# Patient Record
Sex: Male | Born: 2000
Health system: Southern US, Community
[De-identification: ages and names within clinical notes are randomized; demographics above are authoritative.]

## PROBLEM LIST (undated history)

## (undated) DIAGNOSIS — R319 Hematuria, unspecified: Secondary | ICD-10-CM

## (undated) HISTORY — PX: WISDOM TOOTH EXTRACTION: SHX21

## (undated) HISTORY — PX: FOOT SURGERY: SHX648

## (undated) HISTORY — DX: Hematuria, unspecified: R31.9

---
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
## 2018-01-11 ENCOUNTER — Ambulatory Visit: Payer: BLUE CROSS/BLUE SHIELD | Admitting: Nurse Practitioner

## 2018-01-15 ENCOUNTER — Ambulatory Visit (INDEPENDENT_AMBULATORY_CARE_PROVIDER_SITE_OTHER): Payer: BLUE CROSS/BLUE SHIELD | Admitting: Family Medicine

## 2018-01-15 ENCOUNTER — Encounter: Payer: Self-pay | Admitting: Family Medicine

## 2018-01-15 VITALS — BP 110/75 | HR 112 | Temp 98.2°F | Resp 16 | Ht 68.0 in | Wt 154.0 lb

## 2018-01-15 DIAGNOSIS — J029 Acute pharyngitis, unspecified: Secondary | ICD-10-CM

## 2018-01-15 DIAGNOSIS — Z202 Contact with and (suspected) exposure to infections with a predominantly sexual mode of transmission: Secondary | ICD-10-CM

## 2018-01-15 LAB — POCT RAPID STREP A (OFFICE): Rapid Strep A Screen: NEGATIVE

## 2018-01-15 NOTE — Progress Notes (Signed)
Subjective:    Patient ID: Carlos Newman, male    DOB: 03/21/2001, 17 y.o.   MRN: [id number]  Carlos GullyFrederick H Robarts is a 17 y.o. male presenting on 01/15/2018 for Sore Throat (onset week. cough a little) and Abdominal Pain (for a while as per patient)  Patient presents for a same day appointment. Patient's PCP is Wilhelmina McardleLauren Kennedy, AGPCNP-BC  HPI   Sore Throat / Possible Bacterial Pharyngitis vs Oropharyngeal STD - Reports symptoms started about 1 week ago with some "white spots" on throat, still sore to swallow and has swollen lymph nodes in front - Prior history of sore throat again back initially 07/2017, had sick contact sister with strep positive, he had culture that ultimately was negative for strep and he was treated with Azithromycin and it was negative - Concern about environmental exposure with animals and concern with "mold" or "animal dander" - Also had similar issue back in May 2019 with swollen lymph nodes - Also has sick contact with girlfriend who had similar sore throat and exudates in back of throat, he admits that symptoms seemed to have onset after oral sex contact. And believes that this may be related. - He has been to health department before for testing and he had negative testing back in April 2019 before new sexual partner. Denies significant fatigue, headache, vision changes, rash, numbness weakness tingling, dysuria, hematuria, urinary changes, ulcer or lesion or genital rash  Additional complaint with chronic abdominal pain - previously seen in past by prior PCP with dx of GERD, and determined uncertain etiology with generalized abdominal pain, last visit was 05/2017, considered CT imaging and had other work-up at that time.  Depression screen [facility] 2/9 05/11/2017 04/28/2015 01/13/2015  Decreased Interest 0 0 0  Down, Depressed, Hopeless 1 0 0  PHQ - 2 Score 1 0 0  Altered sleeping 2 - -  Tired, decreased energy 2 - -  Change in appetite 2 - -  Feeling bad or  failure about yourself  2 - -  Trouble concentrating 1 - -  Moving slowly or fidgety/restless 1 - -  Suicidal thoughts 0 - -  PHQ-9 Score 11 - -  Difficult doing work/chores Somewhat difficult - -    Social History   Tobacco Use  . Smoking status: Current Every Day Smoker  . Smokeless tobacco: Current User    Types: Chew  Substance Use Topics  . Alcohol use: Yes    Alcohol/week: 0.0 oz  . Drug use: Yes    Types: Marijuana    Review of Systems Per HPI unless specifically indicated above     Objective:    BP 110/75   Pulse (!) 112   Temp 98.2 F (36.8 C) (Oral)   Resp 16   Ht 5\' 8"  (1.727 m)   Wt 154 lb (69.9 kg)   SpO2 100%   BMI 23.42 kg/m   Wt Readings from Last 3 Encounters:  01/15/18 154 lb (69.9 kg) (51 %, Z= 0.02)*  07/31/17 153 lb 12.8 oz (69.8 kg) (53 %, Z= 0.08)*  05/15/17 147 lb (66.7 kg) (43 %, Z= -0.17)*   * Growth percentiles are based on CDC (Boys, 2-20 Years) data.    Physical Exam  Constitutional: He is oriented to person, place, and time. He appears well-developed and well-nourished. No distress.  Well-appearing, comfortable, cooperative  HENT:  Head: Normocephalic and atraumatic.  Frontal / maxillary sinuses non-tender. Nares patent without purulence or edema.  Oropharynx with significantly enlarged  and edematous tonsils bilateral with some erythema similar appearaince possibly R>L without obvious exudates, seems to have large crypts, posterior pharynx with some mild erythema no focal lesions. No ulceration. No drainage or discharge. No evidence of oral thrush or other oral lesions  Eyes: Conjunctivae are normal. Right eye exhibits no discharge. Left eye exhibits no discharge.  Neck: Normal range of motion. Neck supple. No thyromegaly present.  Cardiovascular: Regular rhythm, normal heart sounds and intact distal pulses.  No murmur heard. Tachycardic  Pulmonary/Chest: Effort normal and breath sounds normal. No respiratory distress. He has no  wheezes. He has no rales.  Genitourinary:  Genitourinary Comments: Declined external male genital exam today.  Musculoskeletal: Normal range of motion. He exhibits no edema.  Lymphadenopathy:    He has no cervical adenopathy (L>R upper anterior cervical lymph tenderness without adenopathy).  Neurological: He is alert and oriented to person, place, and time.  Skin: Skin is warm and dry. No rash noted. He is not diaphoretic. No erythema.  Psychiatric: He has a normal mood and affect. His behavior is normal.  Well groomed, good eye contact, normal speech and thoughts  Nursing note and vitals reviewed.  Results for orders placed or performed in visit on 01/15/18  POCT rapid strep A  Result Value Ref Range   Rapid Strep A Screen Negative Negative      Assessment & Plan:   Problem List Items Addressed This Visit    Sore throat - Primary   Relevant Orders   POCT rapid strep A (Completed)    Other Visit Diagnoses    Possible exposure to STD          Clinically persistent pharyngitis with significant symptoms seems to be triggered by oral sex with new partner, in past had similar problem following oral sex. He does not have any other symptoms of STD or urethritis at this time. No known exposure, but partner also has similar sore throat symptoms similar onset. - Prior history of treated strep multiple times, including azithromycin course, he has PCN allergy  Plan Discussion on treatment / testing options - given concern with partner having symptoms, and request for confidentiality/cost - we agreed for him to pursue care and testing at [facility] promptly - I called their office, spoke with nurse triage, he was seen back in 07/2017 for STD check, and he is registered as patient, and they can get him scheduled first thing tomorrow AM for full evaluation and treatment at that time if indicated as discussed.  Ultimately if turns out STD testing is negative, and possibly more  bacterial pharyngitis, could still be from oral sex, or other trigger, he may need future ENT evaluation if abnormal tonsils and recurrent pharyngitis  Regarding chronic abdominal complaint, advised him that I will review this with his PCP and request that he follow-up with her to discuss further, seems last visit 05/2017, and may need advanced imaging or 2nd opinion if continued issues.  No orders of the defined types were placed in this encounter.   Follow up plan: Return in about 1 week (around 01/22/2018), or if symptoms worsen or fail to improve, for sore throat if not improved.   Saralyn Pilar, DO [facility] 01/15/2018, 6:51 PM

## 2018-01-15 NOTE — Patient Instructions (Addendum)
Thank you for coming to the office today.  Please go to health dept today to get soonest available apt - may call   Appointments (for all clinics except [facility])  STD Clinic - [phone]  [facility] Address: [address], [place], [place] [address] Phone: [phone]  Please schedule a Follow-up Appointment to: Return in about 1 week (around 01/22/2018), or if symptoms worsen or fail to improve, for sore throat if not improved.  If you have any other questions or concerns, please feel free to call the office or send a message through MyChart. You may also schedule an earlier appointment if necessary.  Additionally, you may be receiving a survey about your experience at our office within a few days to 1 week by e-mail or mail. We value your feedback.  Saralyn PilarAlexander Karamalegos, DO [facility]

## 2018-01-16 ENCOUNTER — Other Ambulatory Visit: Payer: Self-pay

## 2018-01-16 ENCOUNTER — Ambulatory Visit
Admission: EM | Admit: 2018-01-16 | Discharge: 2018-01-16 | Disposition: A | Discharge status: Home / Self Care | Payer: BLUE CROSS/BLUE SHIELD | Attending: Emergency Medicine | Admitting: Emergency Medicine

## 2018-01-16 DIAGNOSIS — J029 Acute pharyngitis, unspecified: Secondary | ICD-10-CM | POA: Diagnosis not present

## 2018-01-16 DIAGNOSIS — R350 Frequency of micturition: Secondary | ICD-10-CM | POA: Diagnosis not present

## 2018-01-16 DIAGNOSIS — Z113 Encounter for screening for infections with a predominantly sexual mode of transmission: Secondary | ICD-10-CM | POA: Diagnosis not present

## 2018-01-16 LAB — URINALYSIS, COMPLETE (UACMP) WITH MICROSCOPIC
BILIRUBIN URINE: NEGATIVE
Bacteria, UA: NONE SEEN
GLUCOSE, UA: NEGATIVE mg/dL
Hgb urine dipstick: NEGATIVE
Ketones, ur: NEGATIVE mg/dL
LEUKOCYTES UA: NEGATIVE
Nitrite: NEGATIVE
PROTEIN: NEGATIVE mg/dL
RBC / HPF: NONE SEEN RBC/hpf (ref 0–5)
SQUAMOUS EPITHELIAL / LPF: NONE SEEN (ref 0–5)
Specific Gravity, Urine: 1.01 (ref 1.005–1.030)
pH: 7 (ref 5.0–8.0)

## 2018-01-16 LAB — CHLAMYDIA/NGC RT PCR (ARMC ONLY)
CHLAMYDIA TR: NOT DETECTED
N gonorrhoeae: NOT DETECTED

## 2018-01-16 LAB — MONONUCLEOSIS SCREEN: Mono Screen: NEGATIVE

## 2018-01-16 MED ORDER — DEXAMETHASONE SODIUM PHOSPHATE 10 MG/ML IJ SOLN
10.0000 mg | Freq: Once | INTRAMUSCULAR | Status: AC
Start: 2018-01-16 — End: 2018-01-16
  Administered 2018-01-16: 10 mg via INTRAMUSCULAR

## 2018-01-16 MED ORDER — IBUPROFEN 600 MG PO TABS: 600.0000 mg | ORAL_TABLET | Freq: Four times a day (QID) | ORAL | 0 refills | Status: DC | PRN

## 2018-01-16 MED ORDER — FLUTICASONE PROPIONATE 50 MCG/ACT NA SUSP: 2.0000 | Freq: Every day | NASAL | 0 refills | Status: DC

## 2018-01-16 NOTE — ED Provider Notes (Addendum)
HPI  SUBJECTIVE:  Carlos Newman is a 17 y.o. male who presents with exudates on his tonsils, swollen tonsils and cervical lymphadenopathy since May after having oral sex with a new male partner in late May.  States that she had vaginal odor and they did not have vaginal intercourse.  States that she had recently had mono.  He had oral sex with a different male partner last week and reports sore throat, recurrent exudates since then.  He states that she has similar symptoms now as well.  He states that his most recent partner did not have blisters on her genitalia at the time with oral sex.  He reports difficulty swallowing and breathing secondary to swelling, raspy voice.  No drooling, trismus, neck stiffness.  No body aches, headaches, left upper quadrant pain, rash.  No unintentional weight loss, night sweats.  He reports nasal congestion and postnasal drip, but denies allergy symptoms.  He reports urinary frequency, no dysuria, urgency, cloudy odorous urine, hematuria.  No penile rash, discharge, genital itching.  No testicular swelling, pain, scrotal swelling or pain.  He has tried garlic for the tonsillar exudates with improvement in his symptoms.  He also tried ibuprofen 400 mg several days ago.  No aggravating factors.  He is a heavy smoker.  He has a past medical history of UTIs, hematuria, recurrent strep is a child.  No history of gonorrhea, chlamydia, HIV, HSV, syphilis, Trichomonas.  No history of pyelonephritis, nephrolithiasis, mono, diabetes.  Patient reports having unprotected penile/vaginal intercourse with another, different, male partner back in December, and is worried about HIV as well.  ZOX:WRUEAVW, Alison Stalling, NP   Patient saw his primary care physician yesterday, had a negative rapid strep.  He was noted to have large, erythematous tonsils without any obvious exudates.  He agreed to pursue care and testing at the [facility] department yesterday, but states  that he went there this morning and they did not do any testing, so he came here for STD and mono testing. States that he has a penicillin allergy, reaction was over 10 years ago.  Reports flat red rash.  Denies urticaria, lip or tongue swelling, difficulty breathing, diarrhea, syncope, anaphylaxis.  States that he has tolerated Rocephin before.  Past Medical History:  Diagnosis Date  . Hematuria     Past Surgical History:  Procedure Laterality Date  . FOOT SURGERY     Left   . WISDOM TOOTH EXTRACTION      Family History  Problem Relation Age of Onset  . Hematuria Paternal Aunt   . Healthy Mother   . Healthy Father   . Healthy Sister   . Healthy Sister     Social History   Tobacco Use  . Smoking status: Current Some Day Smoker    Types: Cigarettes  . Smokeless tobacco: Current User    Types: Chew  Substance Use Topics  . Alcohol use: Yes    Alcohol/week: 0.0 oz    Comment: social  . Drug use: Yes    Types: Marijuana    Comment: last use yesterday    No current facility-administered medications for this encounter.   Current Outpatient Medications:  .  albuterol (PROVENTIL HFA;VENTOLIN HFA) 108 (90 Base) MCG/ACT inhaler, Inhale 2 puffs into the lungs every 6 (six) hours as needed for wheezing or shortness of breath., Disp: 1 Inhaler, Rfl: 2 .  azithromycin (ZITHROMAX Z-PAK) 250 MG tablet, Start taking azithromycin 250 mg tablet.  Take 2 tablets today.  Then, take 1 tablet daily for 4 days.., Disp: 6 each, Rfl: 0 .  famotidine (PEPCID) 20 MG tablet, Take 1 tablet (20 mg total) by mouth 2 (two) times daily. For 2 weeks.  Then, continue as needed. (Patient not taking: Reported on 01/15/2018), Disp: 60 tablet, Rfl: 0  Allergies  Allergen Reactions  . Penicillin G Itching and Other (See Comments)  . Penicillin V Potassium Other (See Comments)     ROS  As noted in HPI.   Physical Exam  BP 135/89 (BP Location: Right Arm)   Pulse 80   Temp 98 F (36.7 C) (Oral)    Resp 16   Ht 5\' 8"  (1.727 m)   Wt 154 lb (69.9 kg)   SpO2 100%   BMI 23.42 kg/m   Constitutional: Well developed, well nourished, no acute distress Eyes:  EOMI, conjunctiva normal bilaterally HENT: Normocephalic, atraumatic,mucus membranes moist.  No nasal congestion.  Normal turbinates.  Erythematous, swollen tonsils without exudates.  Uvula midline.  Positive cobblestoning.  No postnasal drip.  No intraoral ulcers. Neck: Positive right-sided tender cervical lymph node.  No other anterior or posterior cervical lymphadenopathy Respiratory: Normal inspiratory effort Cardiovascular: Normal rate, regular rhythm, no murmurs, rubs, gallops GI: nondistended soft.  No suprapubic tenderness.  No splenomegaly. Back: No CVAT GU: Deferred.  Patient declined exam. skin: No rash, skin intact Musculoskeletal: no deformities Neurologic: Alert & oriented x 3, no focal neuro deficits Psychiatric: Speech and behavior appropriate   ED Course   Medications  dexamethasone (DECADRON) injection 10 mg (10 mg Intramuscular Given 01/16/18 1034)    Orders Placed This Encounter  Procedures  . Chlamydia/NGC rt PCR ([facility] only)    Standing Status:   Standing    Number of Occurrences:   1  . Ct/GC NAA, Pharyngeal    Standing Status:   Standing    Number of Occurrences:   1  . Urinalysis, Complete w Microscopic    Standing Status:   Standing    Number of Occurrences:   1  . Mononucleosis screen    Standing Status:   Standing    Number of Occurrences:   1  . RPR    Standing Status:   Standing    Number of Occurrences:   1  . HIV antibody    Standing Status:   Standing    Number of Occurrences:   1    Results for orders placed or performed during the hospital encounter of 01/16/18 (from the past 24 hour(s))  Urinalysis, Complete w Microscopic     Status: Abnormal   Collection Time: 01/16/18  9:45 AM  Result Value Ref Range   Color, Urine STRAW (A) YELLOW   APPearance CLEAR CLEAR   Specific  Gravity, Urine 1.010 1.005 - 1.030   pH 7.0 5.0 - 8.0   Glucose, UA NEGATIVE NEGATIVE mg/dL   Hgb urine dipstick NEGATIVE NEGATIVE   Bilirubin Urine NEGATIVE NEGATIVE   Ketones, ur NEGATIVE NEGATIVE mg/dL   Protein, ur NEGATIVE NEGATIVE mg/dL   Nitrite NEGATIVE NEGATIVE   Leukocytes, UA NEGATIVE NEGATIVE   Squamous Epithelial / LPF NONE SEEN 0 - 5   WBC, UA 0-5 0 - 5 WBC/hpf   RBC / HPF NONE SEEN 0 - 5 RBC/hpf   Bacteria, UA NONE SEEN NONE SEEN  Mononucleosis screen     Status: None   Collection Time: 01/16/18 10:27 AM  Result Value Ref Range   Mono Screen NEGATIVE NEGATIVE   No results found.  ED Clinical Impression  No diagnosis found.   ED Assessment/Plan  Outside records, labs reviewed.  As noted in HPI.  As patient has a history of UTIs, will check a urine for UTI.  Also sent off gonorrhea, chlamydia urine and throat PCR.  We will check a mono because of the duration of symptoms.  Also checking HIV, RPR.  Patient declined empiric treatment for STDs today, wants to wait for his labs before being treated.  Advised him that he will need to come back here and get a shot of Rocephin if his labs come back positive for gonorrhea, and that we can call in a prescription of azithromycin for chlamydia.  Will reevaluate.  Do not think that it is strep due to the duration of symptoms.  Throat culture for strep not done.  UA negative for UTI.  Mono negative.  Gonorrhea/chlamydia testing of throat and urine, HIV, RPR pending.  Advised patient to refrain from intercourse until he knows what his lab results are.  Advised him to call here in several days if he does not hear from us to get his lab results.  Dexamethasone 10 mg IM for the tonsillar swelling. airway otherwise appears patent.  Doubt deep space infection, epiglottitis.  Also home with Flonase, ibuprofen 600 mg to take with 1 g of Tylenol 3-4 times a day as needed for pain.  Follow-up with ENT if persistent tonsillar swelling/pain.   Dr. Willeen CassBennett on call.  Discussed labs, MDM, treatment plan, and plan for follow-up with patient. Discussed sn/sx that should prompt return to the ED. patient agrees with plan.   Meds ordered this encounter  Medications  . dexamethasone (DECADRON) injection 10 mg    *This clinic note was created using Scientist, clinical (histocompatibility and immunogenetics)Dragon dictation software. Therefore, there may be occasional mistakes despite careful proofreading.   ?   Domenick GongMortenson, Breslin Hemann, MD 01/16/18 1115    Domenick GongMortenson, Anum Palecek, MD 01/16/18 1145

## 2018-01-16 NOTE — Discharge Instructions (Addendum)
Your mono test was negative.  Your other tests are pending.  We will call you, or you can call here in several days to get your results.  Refrain from sexual contact until you know what your results are.  You may take 600 mg of ibuprofen with 1 g of Tylenol together 3 or 4 times a day as needed for pain.  Try the Flonase which will help with your nasal congestion and postnasal drip.  Follow-up with Dr. Willeen CassBennett, ENT on-call, if all of your labs are negative and your symptoms do not get better in a week or 2.

## 2018-01-16 NOTE — ED Triage Notes (Addendum)
Pt with swollen throat and white spots in throat after oral sex. Pain 3/10. Had negative strep test done yesterday. Concern about STD in throat. Also some burning with urination but no discharge. Saw Dr yesterday and then went to health dept today but they didn't test for chlamydia. Also concern for possible mono

## 2018-01-17 LAB — HIV ANTIBODY (ROUTINE TESTING W REFLEX): HIV SCREEN 4TH GENERATION: NONREACTIVE

## 2018-01-17 LAB — RPR: RPR Ser Ql: NONREACTIVE

## 2018-01-19 LAB — CT/GC NAA, PHARYNGEAL
C TRACH RRNA NPH QL PCR: NEGATIVE
N GONORRHOEA RRNA NPH QL PCR: NEGATIVE

## 2018-01-20 ENCOUNTER — Telehealth: Payer: Self-pay

## 2018-01-20 NOTE — Telephone Encounter (Signed)
Pt calls in for test results. I instructed that all STD testing was negative and his next step is to f/u with Otolaryngology if sx persist (per his D/C instructions). Pt verbalized understanding.

## 2018-01-21 DIAGNOSIS — J351 Hypertrophy of tonsils: Secondary | ICD-10-CM | POA: Diagnosis not present

## 2018-01-21 DIAGNOSIS — J312 Chronic pharyngitis: Secondary | ICD-10-CM | POA: Diagnosis not present

## 2018-01-21 DIAGNOSIS — K219 Gastro-esophageal reflux disease without esophagitis: Secondary | ICD-10-CM | POA: Diagnosis not present

## 2018-01-21 DIAGNOSIS — J352 Hypertrophy of adenoids: Secondary | ICD-10-CM | POA: Diagnosis not present

## 2018-02-01 ENCOUNTER — Ambulatory Visit: Payer: BLUE CROSS/BLUE SHIELD | Admitting: Nurse Practitioner

## 2018-02-02 ENCOUNTER — Other Ambulatory Visit: Payer: Self-pay

## 2018-02-02 ENCOUNTER — Ambulatory Visit
Admission: EM | Admit: 2018-02-02 | Discharge: 2018-02-02 | Disposition: A | Discharge status: Home / Self Care | Payer: BLUE CROSS/BLUE SHIELD | Attending: Family Medicine | Admitting: Family Medicine

## 2018-02-02 DIAGNOSIS — J351 Hypertrophy of tonsils: Secondary | ICD-10-CM

## 2018-02-02 DIAGNOSIS — J029 Acute pharyngitis, unspecified: Secondary | ICD-10-CM | POA: Diagnosis not present

## 2018-02-02 DIAGNOSIS — R1310 Dysphagia, unspecified: Secondary | ICD-10-CM | POA: Diagnosis not present

## 2018-02-02 LAB — RAPID STREP SCREEN (MED CTR MEBANE ONLY): Streptococcus, Group A Screen (Direct): NEGATIVE

## 2018-02-02 LAB — MONONUCLEOSIS SCREEN: MONO SCREEN: NEGATIVE

## 2018-02-02 MED ORDER — CEPHALEXIN 500 MG PO CAPS: 500.0000 mg | ORAL_CAPSULE | Freq: Two times a day (BID) | ORAL | 0 refills | Status: DC

## 2018-02-02 NOTE — ED Triage Notes (Signed)
Pt with off an on sore throat x past few months. Has had several tests done and all have been negative. States he saw Otolaryngology and was put on steroids and allergy meds. Today his sister was diagnosed with strep so wants another strep test done.

## 2018-02-02 NOTE — ED Provider Notes (Signed)
[facility]    CSN: [id number] Arrival date & time: 02/02/18  1042     History   Chief Complaint Chief Complaint  Patient presents with  . Sore Throat    HPI Carlos Newman is a 17 y.o. male. Patient presents with 1 month history of sore throat, enlarged tonsils, swollen lymph nodes, and difficulty swallowing. States he has been negative for strep and mono. His sister and girlfriend have similar symptoms and his sister tested positive for strep today in the clinic. Denies fever. Does admit to fatigue. Has seen ENT specialist 2 weeks ago and treated with prednisone and PPI--states no improvement with this treatment. Patient denies throat culture ever being performed. Currently he denies fever, cough, congestion, abdominal pain, N/V.   HPI  Past Medical History:  Diagnosis Date  . Hematuria     Patient Active Problem List   Diagnosis Date Noted  . Acute bilateral low back pain without sciatica 03/01/2017  . Benign cyst of skin 03/01/2017  . Dysuria 04/28/2015  . Achilles tendinitis 01/13/2015  . Blood in the urine 01/13/2015  . Embedded toenail 01/13/2015  . Ocular migraine 01/13/2015  . Juvenile osteochondrosis of tibia 01/13/2015  . Sore throat 01/13/2015    Past Surgical History:  Procedure Laterality Date  . FOOT SURGERY     Left   . WISDOM TOOTH EXTRACTION         Home Medications    Prior to Admission medications   Medication Sig Start Date End Date Taking? Authorizing Provider  albuterol (PROVENTIL HFA;VENTOLIN HFA) 108 (90 Base) MCG/ACT inhaler Inhale 2 puffs into the lungs every 6 (six) hours as needed for wheezing or shortness of breath. 12/19/16   Galen ManilaKennedy, Lauren Renee, NP  famotidine (PEPCID) 20 MG tablet Take 1 tablet (20 mg total) by mouth 2 (two) times daily. For 2 weeks.  Then, continue as needed. Patient not taking: Reported on 01/15/2018 05/11/17   Galen ManilaKennedy, Lauren Renee, NP  fluticasone [facility](FLONASE) 50 MCG/ACT nasal spray Place 2  sprays into both nostrils daily. 01/16/18   Domenick GongMortenson, Ashley, MD  ibuprofen (ADVIL,MOTRIN) 600 MG tablet Take 1 tablet (600 mg total) by mouth every 6 (six) hours as needed. 01/16/18   Domenick GongMortenson, Ashley, MD  methylPREDNISolone (MEDROL DOSEPAK) 4 MG TBPK tablet FPD 01/21/18   [provider]  omeprazole (PRILOSEC) 20 MG capsule TK 1 C PO 30 MIN B BRE 01/21/18   [provider]    Family History Family History  Problem Relation Age of Onset  . Hematuria Paternal Aunt   . Healthy Mother   . Healthy Father   . Healthy Sister   . Healthy Sister     Social History Social History   Tobacco Use  . Smoking status: Current Some Day Smoker    Types: Cigarettes  . Smokeless tobacco: Current User    Types: Chew  Substance Use Topics  . Alcohol use: Yes    Alcohol/week: 0.0 standard drinks    Comment: social  . Drug use: Yes    Types: Marijuana     Allergies   Penicillin g and Penicillin v potassium   Review of Systems Review of Systems  Constitutional: Positive for chills and fatigue. Negative for fever.  HENT: Positive for sore throat and trouble swallowing. Negative for congestion and rhinorrhea.   Respiratory: Negative for chest tightness and shortness of breath.   Cardiovascular: Negative for chest pain.  Gastrointestinal: Positive for abdominal pain. Negative for nausea and vomiting.  Musculoskeletal: Negative for myalgias.  Skin: Negative for rash.  Neurological: Negative for light-headedness.     Physical Exam Triage Vital Signs ED Triage Vitals  Enc Vitals Group     BP 02/02/18 1056 129/78     Pulse Rate 02/02/18 1056 (!) 59     Resp 02/02/18 1056 16     Temp 02/02/18 1056 97.6 F (36.4 C)     Temp Source 02/02/18 1056 Oral     SpO2 02/02/18 1056 100 %     Weight 02/02/18 1059 154 lb 1.6 oz (69.9 kg)     Height 02/02/18 1059 5\' 8"  (1.727 m)     Head Circumference --      Peak Flow --      Pain Score 02/02/18 1058 4     Pain Loc --      Pain  Edu? --      Excl. in GC? --    No data found.  Updated Vital Signs BP 129/78 (BP Location: Right Arm)   Pulse (!) 59   Temp 97.6 F (36.4 C) (Oral)   Resp 16   Ht 5\' 8"  (1.727 m)   Wt 154 lb 1.6 oz (69.9 kg)   SpO2 100%   BMI 23.43 kg/m       Physical Exam  Constitutional: He is oriented to person, place, and time. He appears well-developed and well-nourished.  Non-toxic appearance. No distress.  HENT:  Head: Normocephalic and atraumatic.  Right Ear: Hearing, tympanic membrane and ear canal normal.  Left Ear: Hearing, tympanic membrane and ear canal normal.  Mouth/Throat: Uvula is midline and mucous membranes are normal. No uvula swelling. Posterior oropharyngeal edema and posterior oropharyngeal erythema (prominenet clear PND) present. No oropharyngeal exudate. Tonsils are 2+ on the right. Tonsils are 2+ on the left. No tonsillar exudate.  Eyes: Pupils are equal, round, and reactive to light. EOM are normal.  Neck: Normal range of motion. Neck supple.  Cardiovascular: Normal rate and regular rhythm.  No murmur heard. Pulmonary/Chest: Effort normal and breath sounds normal. He has no wheezes. He has no rhonchi. He has no rales.  Abdominal: Soft. There is no tenderness.  Lymphadenopathy:    He has cervical adenopathy.  Neurological: He is alert and oriented to person, place, and time.  Skin: Skin is warm and dry. No rash noted. No erythema.     UC Treatments / Results  Labs (all labs ordered are listed, but only abnormal results are displayed) Labs Reviewed  RAPID STREP SCREEN ([facility] ONLY)  CULTURE, GROUP A STREP ([facility])  AEROBIC/ANAEROBIC CULTURE (SURGICAL/DEEP WOUND)  MONONUCLEOSIS SCREEN    EKG None  Radiology No results found.  Procedures Procedures (including critical care time)  Medications Ordered in UC Medications - No data to display  Initial Impression / Assessment and Plan / UC Course  I have reviewed the triage vital signs and the  nursing notes.  Pertinent labs & imaging results that were available during my care of the patient were reviewed by me and considered in my medical decision making (see chart for details).   Rapid strep test is negative today. A throat culture will be sent out to test for any bacterial pathogens. Mono screen will also be performed. Will treat for suspected bacterial pharyngitis with Keflex. Has allergy to PCN but this is rash only and I have reviewed what to do if allergic reaction including going to ER for severe reaction   Final Clinical Impressions(s) / UC Diagnoses  Final diagnoses:  None   Discharge Instructions   None    ED Prescriptions    None     Controlled Substance Prescriptions [place] Controlled Substance Registry consulted? Not Applicable   Gareth Morgan 02/02/18 1437

## 2018-02-02 NOTE — Discharge Instructions (Addendum)
Begin Keflex for suspected bacterial sore throat. Throat culture sent. You will be notified with results. Rest and increase fluid intake. F/u with PCP or ENT if condition not improving with treatment.

## 2018-02-05 ENCOUNTER — Telehealth (HOSPITAL_COMMUNITY): Payer: Self-pay | Admitting: Emergency Medicine

## 2018-02-05 LAB — CULTURE, GROUP A STREP (THRC)

## 2018-02-05 NOTE — Telephone Encounter (Signed)
Lab calling with lab results:  Aerobic/Anaerobic Culture (surgical/deep wound)  Order: [id number]  Status:  Preliminary result Visible to patient:  No (Not Released) Next appt:  None  Specimen Information: Throat     Component 3d ago  Specimen Description THROAT  Performed at [facility], [address] [place], [place] [address]   Special Requests Normal  Performed at [facility], [address] [place], [place] [address]   Gram Stain RARE WBC PRESENT, PREDOMINANTLY MONONUCLEAR  RARE GRAM POSITIVE COCCI IN PAIRS  RARE GRAM NEGATIVE RODS  RARE GRAM POSITIVE COCCI IN CLUSTERS   Culture MODERATE FUSOBACTERIUM NECROPHORUM  BETA LACTAMASE POSITIVE  CRITICAL RESULT CALLED TO, READ BACK BY AND VERIFIED WITH: [facility] RN AT 1500 ON [id number] BY SJW  Performed at [facility], [address] [place], [place] [address]   Report Status PENDING   Resulting Agency [facility]      Specimen Collected: 02/02/18 11:48 Last Resulted: 02/05/18 14:40     Lab Flowsheet    Order Details    View Encounter    Lab and Collection Details    Routing    Result History

## 2018-02-07 ENCOUNTER — Encounter: Payer: Self-pay | Admitting: *Deleted

## 2018-02-07 ENCOUNTER — Other Ambulatory Visit: Payer: Self-pay

## 2018-02-07 ENCOUNTER — Telehealth (HOSPITAL_COMMUNITY): Payer: Self-pay

## 2018-02-07 ENCOUNTER — Emergency Department
Admission: EM | Admit: 2018-02-07 | Discharge: 2018-02-07 | Disposition: A | Discharge status: Home / Self Care | Payer: BLUE CROSS/BLUE SHIELD | Attending: Emergency Medicine | Admitting: Emergency Medicine

## 2018-02-07 DIAGNOSIS — F17210 Nicotine dependence, cigarettes, uncomplicated: Secondary | ICD-10-CM | POA: Diagnosis not present

## 2018-02-07 DIAGNOSIS — B9689 Other specified bacterial agents as the cause of diseases classified elsewhere: Secondary | ICD-10-CM | POA: Insufficient documentation

## 2018-02-07 DIAGNOSIS — J028 Acute pharyngitis due to other specified organisms: Secondary | ICD-10-CM | POA: Diagnosis not present

## 2018-02-07 DIAGNOSIS — A498 Other bacterial infections of unspecified site: Secondary | ICD-10-CM | POA: Diagnosis not present

## 2018-02-07 DIAGNOSIS — Z79899 Other long term (current) drug therapy: Secondary | ICD-10-CM | POA: Insufficient documentation

## 2018-02-07 DIAGNOSIS — J208 Acute bronchitis due to other specified organisms: Secondary | ICD-10-CM | POA: Diagnosis not present

## 2018-02-07 DIAGNOSIS — J029 Acute pharyngitis, unspecified: Secondary | ICD-10-CM | POA: Diagnosis present

## 2018-02-07 LAB — CBC WITH DIFFERENTIAL/PLATELET
BASOS ABS: 0.1 10*3/uL (ref 0–0.1)
Basophils Relative: 1 %
Eosinophils Absolute: 0.1 10*3/uL (ref 0–0.7)
Eosinophils Relative: 1 %
HCT: 44.8 % (ref 40.0–52.0)
Hemoglobin: 15.4 g/dL (ref 13.0–18.0)
LYMPHS ABS: 2.9 10*3/uL (ref 1.0–3.6)
LYMPHS PCT: 29 %
MCH: 31.6 pg (ref 26.0–34.0)
MCHC: 34.4 g/dL (ref 32.0–36.0)
MCV: 91.8 fL (ref 80.0–100.0)
Monocytes Absolute: 0.7 10*3/uL (ref 0.2–1.0)
Monocytes Relative: 7 %
NEUTROS ABS: 6.2 10*3/uL (ref 1.4–6.5)
Neutrophils Relative %: 62 %
PLATELETS: 231 10*3/uL (ref 150–440)
RBC: 4.87 MIL/uL (ref 4.40–5.90)
RDW: 13.1 % (ref 11.5–14.5)
WBC: 10 10*3/uL (ref 3.8–10.6)

## 2018-02-07 LAB — BASIC METABOLIC PANEL
ANION GAP: 5 (ref 5–15)
BUN: 15 mg/dL (ref 6–20)
CHLORIDE: 105 mmol/L (ref 98–111)
CO2: 31 mmol/L (ref 22–32)
Calcium: 9.4 mg/dL (ref 8.9–10.3)
Creatinine, Ser: 0.94 mg/dL (ref 0.61–1.24)
GFR calc Af Amer: >60 mL/min (ref >60)
GLUCOSE: 107 mg/dL — AB (ref 70–99)
POTASSIUM: 4 mmol/L (ref 3.5–5.1)
Sodium: 141 mmol/L (ref 135–145)

## 2018-02-07 LAB — AEROBIC/ANAEROBIC CULTURE W GRAM STAIN (SURGICAL/DEEP WOUND): Special Requests: NORMAL

## 2018-02-07 LAB — AEROBIC/ANAEROBIC CULTURE (SURGICAL/DEEP WOUND)

## 2018-02-07 MED ORDER — METRONIDAZOLE 500 MG PO TABS: 500.0000 mg | ORAL_TABLET | Freq: Three times a day (TID) | ORAL | 0 refills | Status: AC

## 2018-02-07 MED ORDER — CEFTRIAXONE SODIUM 2 G IJ SOLR
2.0000 g | Freq: Once | INTRAMUSCULAR | Status: AC
  Administered 2018-02-07: 2 g via INTRAVENOUS
  Filled 2018-02-07: qty 20

## 2018-02-07 MED ORDER — METRONIDAZOLE IN NACL 5-0.79 MG/ML-% IV SOLN
500.0000 mg | Freq: Once | INTRAVENOUS | Status: AC
  Administered 2018-02-07: 500 mg via INTRAVENOUS
  Filled 2018-02-07: qty 100

## 2018-02-07 MED ORDER — CEPHALEXIN 500 MG PO CAPS: 500.0000 mg | ORAL_CAPSULE | Freq: Three times a day (TID) | ORAL | 0 refills | Status: AC

## 2018-02-07 NOTE — ED Triage Notes (Addendum)
Pt to ED reporting continued sore throat that has been worsening since March with positive strep tests and antibiotic treatments. PCP sent off a strep culture and reported they needed to come to the ED for new antibiotics. 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 Pt also reporting congestion and ear aches. No fevers.   Sister is present with the same symptoms.

## 2018-02-07 NOTE — Telephone Encounter (Signed)
Dr. Adriana Simasook notified of patient culture. Patient called and reports continued symptoms. Pt referred to go to the hospital for evaluation due to positive Fusobacterium necrophorum. Pt verbalized understanding.

## 2018-02-07 NOTE — Discharge Instructions (Signed)
Take the two antibiotics ass directed. [facility] as directed.

## 2018-02-07 NOTE — ED Provider Notes (Signed)
-----------------------------------------   6:39 PM on 02/07/2018 -----------------------------------------  I discussed with Dr. Andee PolesVaught of ENT given the throat cultures.  States this is a very common finding and pharyngitis, would recommend treatment with clindamycin.  States the patient will likely need ENT referral for consideration of possible tonsillectomy in the future.  Reassuringly today the patient's labs are normal vitals are normal.  I discussed with physician assistant, will likely discharge on clindamycin.   [name], [name], MD 02/07/18 1840

## 2018-02-07 NOTE — ED Provider Notes (Signed)
[facility] Emergency Department Provider Note ____________________________________________  Time seen: 1750  I have reviewed the triage vital signs and the nursing notes.  HISTORY  Chief Complaint  Sore Throat  HPI Carlos Newman is a 17 y.o. male who presents to the ED accompanied by his sibling, for management of a recent throat culture result.  Patient had a sibling has been treated over the past several months for recurrent sore throat and other URI infections.  Patient reports no worsening of his symptoms since March.  He has had intermittent positive strep test but has not had complete resolution of his symptoms.  The throat culture result became available today, and he was advised to report to the ED for antibiotic management.  Patient denies any interim complaints.  He is currently on a course of Keflex as well as recent treatment.  Past Medical History:  Diagnosis Date  . Hematuria     Patient Active Problem List   Diagnosis Date Noted  . Acute bilateral low back pain without sciatica 03/01/2017  . Benign cyst of skin 03/01/2017  . Dysuria 04/28/2015  . Achilles tendinitis 01/13/2015  . Blood in the urine 01/13/2015  . Embedded toenail 01/13/2015  . Ocular migraine 01/13/2015  . Juvenile osteochondrosis of tibia 01/13/2015  . Sore throat 01/13/2015    Past Surgical History:  Procedure Laterality Date  . FOOT SURGERY     Left   . WISDOM TOOTH EXTRACTION      Prior to Admission medications   Medication Sig Start Date End Date Taking? Authorizing Provider  albuterol (PROVENTIL HFA;VENTOLIN HFA) 108 (90 Base) MCG/ACT inhaler Inhale 2 puffs into the lungs every 6 (six) hours as needed for wheezing or shortness of breath. 12/19/16   Galen Manila, NP  cephALEXin (KEFLEX) 500 MG capsule Take 1 capsule (500 mg total) by mouth 3 (three) times daily for 7 days. 02/07/18 02/14/18  Delrae Hagey, Charlesetta Ivory, PA-C  famotidine (PEPCID) 20 MG  tablet Take 1 tablet (20 mg total) by mouth 2 (two) times daily. For 2 weeks.  Then, continue as needed. Patient not taking: Reported on 01/15/2018 05/11/17   Galen Manila, NP  fluticasone [facility]) 50 MCG/ACT nasal spray Place 2 sprays into both nostrils daily. 01/16/18   Domenick Gong, MD  ibuprofen (ADVIL,MOTRIN) 600 MG tablet Take 1 tablet (600 mg total) by mouth every 6 (six) hours as needed. 01/16/18   Domenick Gong, MD  methylPREDNISolone (MEDROL DOSEPAK) 4 MG TBPK tablet FPD 01/21/18   [provider]  metroNIDAZOLE (FLAGYL) 500 MG tablet Take 1 tablet (500 mg total) by mouth 3 (three) times daily for 7 days. 02/07/18 02/14/18  Hildegard Hlavac, Charlesetta Ivory, PA-C  omeprazole (PRILOSEC) 20 MG capsule TK 1 C PO 30 MIN B BRE 01/21/18   [provider]    Allergies Penicillin g and Penicillin v potassium  Family History  Problem Relation Age of Onset  . Hematuria Paternal Aunt   . Healthy Mother   . Healthy Father   . Healthy Sister   . Healthy Sister     Social History Social History   Tobacco Use  . Smoking status: Current Some Day Smoker    Types: Cigarettes  . Smokeless tobacco: Current User    Types: Chew  Substance Use Topics  . Alcohol use: Yes    Alcohol/week: 0.0 standard drinks    Comment: social  . Drug use: Yes    Types: Marijuana    Review  of Systems  Constitutional: Negative for fever. Eyes: Negative for visual changes. ENT: Significant for sore throat. Cardiovascular: Negative for chest pain. Respiratory: Negative for shortness of breath. Gastrointestinal: Negative for abdominal pain, vomiting and diarrhea. Genitourinary: Negative for dysuria. Musculoskeletal: Negative for back pain. Skin: Negative for rash. Neurological: Negative for headaches, focal weakness or numbness. ____________________________________________  PHYSICAL EXAM:  VITAL SIGNS: ED Triage Vitals  Enc Vitals Group     BP 02/07/18 1701 (!) 111/54     Pulse  Rate 02/07/18 1701 79     Resp 02/07/18 1701 16     Temp 02/07/18 1701 98.4 F (36.9 C)     Temp Source 02/07/18 1701 Oral     SpO2 02/07/18 1701 97 %     Weight 02/07/18 1700 154 lb 1.6 oz (69.9 kg)     Height 02/07/18 1700 5\' 8"  (1.727 m)     Head Circumference --      Peak Flow --      Pain Score 02/07/18 1700 2     Pain Loc --      Pain Edu? --      Excl. in GC? --     Constitutional: Alert and oriented. Well appearing and in no distress. Head: Normocephalic and atraumatic. Eyes: Conjunctivae are normal. PERRL. Normal extraocular movements Ears: Canals clear. TMs intact bilaterally. Nose: No congestion/rhinorrhea/epistaxis. Mouth/Throat: Mucous membranes are moist.  Uvula is midline and tonsils are enlarged, but without erythema or exudate. Neck: Supple. No thyromegaly. Hematological/Lymphatic/Immunological: No cervical lymphadenopathy. Cardiovascular: Normal rate, regular rhythm. Normal distal pulses. Respiratory: Normal respiratory effort. No wheezes/rales/rhonchi. Skin:  Skin is warm, dry and intact. No rash noted. ____________________________________________   LABS (pertinent positives/negatives)  Labs Reviewed  BASIC METABOLIC PANEL - Abnormal; Notable for the following components:      Result Value   Glucose, Bld 107 (*)    All other components within normal limits  CBC WITH DIFFERENTIAL/PLATELET  ____________________________________________  PROCEDURES  Procedures Flagyl 500 mg IVP Rocephin 2 g IVP ____________________________________________  INITIAL IMPRESSION / ASSESSMENT AND PLAN / ED COURSE  Patient with ED evaluation and management of a confirmed throat culture. 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 He will be discharged with instructions to increase his Keflex to TID dosing. He will be also discharged with Flagyl to dose TID. He will follow-up with [facility] for further management.   ____________________________________________  FINAL CLINICAL IMPRESSION(S) / ED DIAGNOSES  Final diagnoses:  Acute pharyngitis due to other specified organisms  Fusobacterium infection      Hasina Kreager, Charlesetta IvoryJenise V Bacon, PA-C 02/11/18 1903    [name], Kevin, MD 02/12/18 1900

## 2018-02-07 NOTE — ED Notes (Signed)
See triage note  Presents with positive throat culture  Has had sore throat since march  Afebrile on arrival

## 2018-02-21 DIAGNOSIS — J309 Allergic rhinitis, unspecified: Secondary | ICD-10-CM | POA: Diagnosis not present

## 2018-02-21 DIAGNOSIS — J351 Hypertrophy of tonsils: Secondary | ICD-10-CM | POA: Diagnosis not present

## 2018-02-21 DIAGNOSIS — K219 Gastro-esophageal reflux disease without esophagitis: Secondary | ICD-10-CM | POA: Diagnosis not present

## 2018-11-12 ENCOUNTER — Ambulatory Visit (INDEPENDENT_AMBULATORY_CARE_PROVIDER_SITE_OTHER): Payer: BC Managed Care – PPO | Admitting: Family Medicine

## 2018-11-12 ENCOUNTER — Other Ambulatory Visit: Payer: Self-pay

## 2018-11-12 ENCOUNTER — Encounter: Payer: Self-pay | Admitting: Family Medicine

## 2018-11-12 VITALS — BP 111/59 | HR 73 | Temp 98.3°F | Resp 16 | Ht 68.0 in | Wt 151.6 lb

## 2018-11-12 DIAGNOSIS — R1013 Epigastric pain: Secondary | ICD-10-CM

## 2018-11-12 DIAGNOSIS — R1031 Right lower quadrant pain: Secondary | ICD-10-CM | POA: Diagnosis not present

## 2018-11-12 DIAGNOSIS — N50812 Left testicular pain: Secondary | ICD-10-CM | POA: Diagnosis not present

## 2018-11-12 DIAGNOSIS — Z87898 Personal history of other specified conditions: Secondary | ICD-10-CM

## 2018-11-12 DIAGNOSIS — Z87448 Personal history of other diseases of urinary system: Secondary | ICD-10-CM

## 2018-11-12 NOTE — Progress Notes (Signed)
Subjective:    Patient ID: Carlos Newman, male    DOB: 08/16/2000, 18 y.o.   MRN: [id number]  Carlos Newman is a 18 y.o. male presenting on 11/12/2018 for Abdominal Pain (onset 2 years diet not improving his pain and is mostly on Right side)  PCP is Wilhelmina McardleLauren Kennedy, AGPCNP-BC - I am currently covering during her maternity leave.   HPI   Chronic Abdominal Pain (Epigastric, RLQ) / Left Testicular discomfort vs pain History of gross hematuria Chronic problems, constellation of symptoms, reviewed background history, 3-4 years ago first symptom, urinated blood and continued to still have this issue, still had that problem recurrent. He even was told he had microscopic blood. Had Renal US in 2016 looked for kidney stone. - Next he developed abdominal discomfort epigastric had digestive symptoms for a while, has been given variety of diagnosis in past including IBS constipation, also thought to have hernia. He has had [facility], unremarkable - he has changed diet and improved healthy diet Also background history of significant injury to R mid to lower abdomen with fishing fish hook injury large hook puncture wound into abdominal muscle wall fascia did not puncture into abdominal cavity required removal, some pain has developed since that time.  - Today he is here because still no relief. Seems to have persistent abdominal discomfort. He has been eating very healthy, drinking mostly water. Symptoms not provoked by food or drink or activity, he has mid abdominal epigastric pain that is worsening moderate to severe. Also R side of abdomen lower quadrant has discomfort, he notices issue where it "pushes out" more with breathing - Tried Pepcid, Omeprazole among other meds without relief Not taking any OTC analgesia currently NSAID or Tylenol - Describes new problem now worsening recently in few week with Left testicular discomfort at top of testicle and it seems to  "raise up" higher than other when take deep breath and then it goes back down, no trauma or injury Denies testicular bruising redness swelling, other groin pain or bulging or herniation   Depression screen [facility] 2/9 05/11/2017 04/28/2015 01/13/2015  Decreased Interest 0 0 0  Down, Depressed, Hopeless 1 0 0  PHQ - 2 Score 1 0 0  Altered sleeping 2 - -  Tired, decreased energy 2 - -  Change in appetite 2 - -  Feeling bad or failure about yourself  2 - -  Trouble concentrating 1 - -  Moving slowly or fidgety/restless 1 - -  Suicidal thoughts 0 - -  PHQ-9 Score 11 - -  Difficult doing work/chores Somewhat difficult - -    Social History   Tobacco Use  . Smoking status: Current Some Day Smoker    Types: Cigarettes  . Smokeless tobacco: Current User    Types: Chew  Substance Use Topics  . Alcohol use: Yes    Alcohol/week: 0.0 standard drinks    Comment: social  . Drug use: Yes    Types: Marijuana    Review of Systems Per HPI unless specifically indicated above     Objective:    BP (!) 111/59   Pulse 73   Temp 98.3 F (36.8 C) (Oral)   Resp 16   Ht 5\' 8"  (1.727 m)   Wt 151 lb 9.6 oz (68.8 kg)   BMI 23.05 kg/m   Wt Readings from Last 3 Encounters:  11/12/18 151 lb 9.6 oz (68.8 kg)  02/07/18 154 lb 1.6 oz (69.9 kg) (  50 %, Z= 0.01)*  02/02/18 154 lb 1.6 oz (69.9 kg) (51 %, Z= 0.01)*   * Growth percentiles are based on CDC (Boys, 2-20 Years) data.    Physical Exam Vitals signs and nursing note reviewed.  Constitutional:      General: He is not in acute distress.    Appearance: He is well-developed. He is not diaphoretic.     Comments: Well-appearing, comfortable, cooperative  HENT:     Head: Normocephalic and atraumatic.  Eyes:     General:        Right eye: No discharge.        Left eye: No discharge.     Conjunctiva/sclera: Conjunctivae normal.  Cardiovascular:     Rate and Rhythm: Normal rate.  Pulmonary:     Effort: Pulmonary effort is normal.  Abdominal:      Tenderness: There is abdominal tenderness in the right lower quadrant and epigastric area. There is no right CVA tenderness, left CVA tenderness, guarding or rebound. Negative signs include McBurney's sign and psoas sign.     Hernia: No hernia is present. There is no hernia in the ventral area (questionable if has some diasis recti vs some slight separation midline above umbilicus in area of discomfort), right inguinal area or left inguinal area.  Genitourinary:    Penis: Normal.      Comments: Scrotum normal. Left testicle no edema no erythema, minimal discomfort only virtually normal, localized to top of testicle, does demonstrate fairly increased mobility with deep inhalation raises testicle up, no evidence of torsion Skin:    General: Skin is warm and dry.     Findings: No erythema or rash.  Neurological:     Mental Status: He is alert and oriented to person, place, and time.  Psychiatric:        Behavior: Behavior normal.     Comments: Well groomed, good eye contact, normal speech and thoughts    Results for orders placed or performed during the hospital encounter of 02/07/18  Basic metabolic panel  Result Value Ref Range   Sodium 141 135 - 145 mmol/L   Potassium 4.0 3.5 - 5.1 mmol/L   Chloride 105 98 - 111 mmol/L   CO2 31 22 - 32 mmol/L   Glucose, Bld 107 (H) 70 - 99 mg/dL   BUN 15 6 - 20 mg/dL   Creatinine, Ser 2.84 0.61 - 1.24 mg/dL   Calcium 9.4 8.9 - 13.2 mg/dL   GFR calc non Af Amer >60 >60 mL/min   GFR calc Af Amer >60 >60 mL/min   Anion gap 5 5 - 15  CBC with Differential  Result Value Ref Range   WBC 10.0 3.8 - 10.6 K/uL   RBC 4.87 4.40 - 5.90 MIL/uL   Hemoglobin 15.4 13.0 - 18.0 g/dL   HCT 44.0 10.2 - 72.5 %   MCV 91.8 80.0 - 100.0 fL   MCH 31.6 26.0 - 34.0 pg   MCHC 34.4 32.0 - 36.0 g/dL   RDW 36.6 44.0 - 34.7 %   Platelets 231 150 - 440 K/uL   Neutrophils Relative % 62 %   Neutro Abs 6.2 1.4 - 6.5 K/uL   Lymphocytes Relative 29 %   Lymphs Abs 2.9 1.0 -  3.6 K/uL   Monocytes Relative 7 %   Monocytes Absolute 0.7 0.2 - 1.0 K/uL   Eosinophils Relative 1 %   Eosinophils Absolute 0.1 0 - 0.7 K/uL   Basophils Relative 1 %  Basophils Absolute 0.1 0 - 0.1 K/uL      Assessment & Plan:   Problem List Items Addressed This Visit    None    Visit Diagnoses    Epigastric abdominal pain    -  Primary   Relevant Orders   Ambulatory referral to Gastroenterology   Right lower quadrant abdominal pain       Relevant Orders   Ambulatory referral to Gastroenterology   Left testicular pain       Relevant Orders   Ambulatory referral to Urology   History of gross hematuria       Relevant Orders   Ambulatory referral to Urology      Discussion on concerns today, given chronicity of these problems >3-4 years, and some more recent issues, various systems, uncertain clear etiology at this time, patient is requesting referral for specialist.  I am covering for patient's PCP today, initial discussion on these issues, agree with 2nd opinions.  Referral to [facility] for evaluation of chronic 3-4 year worsening abdominal pain and discomfort, mostly epigastric, and now more recent R sided discomfort, seems positional at times, had a hook injury to R lower abdominal wall muscle, seems to have digestive complaints somewhat functional, had normal abdominal ultrasound, not always triggered by eating or dietary, no relief on PPI H2 blocker. Consider imaging with CT if indicated. Requesting evaluation. In future if related more to possible anatomical defect or hernia may refer to general surgery  Referral to [facility] for left testicular pain, episodic, seems related to breathing and abdominal wall muscles if deep breath in now has more movement of left testicle, no trauma or injury, no discoloration or other complication. History of gross hematuria years ago, since resolved but has had microscopic hematuria, also complicated by  chronic abdominal pain, question if possible kidney stone, or other issue - consider referral to surgery in future if concern for hernia  Orders Placed This Encounter  Procedures  . Ambulatory referral to Gastroenterology    Referral Priority:   Routine    Referral Type:   Consultation    Referral Reason:   Specialty Services Required    Number of Visits Requested:   1  . Ambulatory referral to Urology    Referral Priority:   Routine    Referral Type:   Consultation    Referral Reason:   Specialty Services Required    Requested Specialty:   Urology    Number of Visits Requested:   1    No orders of the defined types were placed in this encounter.    Follow up plan: Return in about 3 months (around 02/12/2019), or if symptoms worsen or fail to improve, for Follow-up abdominal pain, testicular pain - GI / Urology updates.  Saralyn Pilar, DO [facility] 11/12/2018, 3:46 PM

## 2018-11-12 NOTE — Patient Instructions (Addendum)
Thank you for coming to the office today.  Stay tuned for apt with specialist from both of these locations - if not heard 1-2 weeks then call them to check status  [facility]) [address] - [address] [place], [place] [address] Phone: [phone]  -------------------------------------------  [facility] -1st floor [address] [place],  [place]  [address] Phone: [phone]  Lastly from our discussion - if turns out more anatomical muscle wall issues - we can refer to a General Surgeon if needed.  Please schedule a Follow-up Appointment to: Return in about 3 months (around 02/12/2019), or if symptoms worsen or fail to improve, for Follow-up abdominal pain, testicular pain - GI / Urology updates.  If you have any other questions or concerns, please feel free to call the office or send a message through MyChart. You may also schedule an earlier appointment if necessary.  Additionally, you may be receiving a survey about your experience at our office within a few days to 1 week by e-mail or mail. We value your feedback.  Saralyn Pilar, DO [facility] [place]

## 2018-11-14 NOTE — Progress Notes (Signed)
11/15/2018 9:21 AM   Carlos LericheFrederick H Newman 10/08/2000 [id number]  Referring provider: Galen ManilaKennedy, Lauren Renee, NP [address] [place], [place] [id number]  Chief Complaint  Patient presents with  . Testicle Pain    HPI: Carlos Newman is a 18 year old Caucasian male who is referred by Dr. Althea CharonKaramalegos for left testicular pain.   He was evaluated for gross hematuria and right sided pain in 2016 with Carlos Newman.  No findings for hematuria.  RUS on 01/2015 was negative.    In 2018, he was having similar symptoms and was told he was constipation.  He started to take supplements and eating a healthier diet which relieved the constipation.    He has now been having right sided abdominal pain for approximately one year.   He states that he has noted a lump with discoloration of the skin in that area on occasion as well.  He states this occurs mostly when he has been standing for long periods of time or straining.  He has not had fevers/chills or nausea/vomiting with the pain.  He was stuck with a fishing hook in that area, but he states it did not penetrate into the abdominal organs.    He also has been having epigastric pain.  He has not noted any relationship to eating with the pain.  He has not been vomiting.  His BM's are normal.    Last week, he noticed that his left testicle was rising and falling within the scrotal sac with his breathing and associated with a dull pain.  He denies the testicle rising into the inguinal canal.  He has had unprotected sex about one month ago.  He has not had any penile discharge or penile burning.  He has not noticed any difficulty or pain with his erections.  He does not have any curvature with his erections.  He is still having spontaneous erections and has a good libido.  He does not have pain with ejaculation.    He has had nocturia x 2 for several months.  He states he snores and wakes up a lot at night.  He does not have a family history of sleep apnea.  He  has not been evaluated for sleep apnea.    His UA negative.   His PVR is 3 mL.    PMH: Past Medical History:  Diagnosis Date  . Hematuria     Surgical History: Past Surgical History:  Procedure Laterality Date  . FOOT SURGERY     Left   . WISDOM TOOTH EXTRACTION      Home Medications:  Allergies as of 11/15/2018      Reactions   Penicillin G Itching, Other (See Comments)   Penicillin V Potassium Other (See Comments)      Medication List    as of November 15, 2018  9:21 AM   You have not been prescribed any medications.     Allergies:  Allergies  Allergen Reactions  . Penicillin G Itching and Other (See Comments)  . Penicillin V Potassium Other (See Comments)    Family History: Family History  Problem Relation Age of Onset  . Hematuria Paternal Aunt   . Healthy Mother   . Healthy Father   . Healthy Sister   . Healthy Sister     Social History:  reports that he has been smoking cigarettes. His smokeless tobacco use includes chew. He reports current alcohol use. He reports current drug use. Drug: Marijuana.  ROS: UROLOGY Frequent Urination?: No Hard to postpone urination?: No Burning/pain with urination?: No Get up at night to urinate?: Yes Leakage of urine?: No Urine stream starts and stops?: No Trouble starting stream?: No Do you have to strain to urinate?: No Blood in urine?: Yes Urinary tract infection?: No Sexually transmitted disease?: No Injury to kidneys or bladder?: No Painful intercourse?: No Weak stream?: No Erection problems?: No Penile pain?: No  Gastrointestinal Nausea?: No Vomiting?: No Indigestion/heartburn?: No Diarrhea?: No Constipation?: No  Constitutional Fever: No Night sweats?: No Weight loss?: No Fatigue?: No  Skin Skin rash/lesions?: No Itching?: No  Eyes Blurred vision?: No Double vision?: No  Ears/Nose/Throat Sore throat?: No Sinus problems?: No  Hematologic/Lymphatic Swollen glands?: No Easy bruising?:  No  Cardiovascular Leg swelling?: No  Respiratory Cough?: No Shortness of breath?: No  Endocrine Excessive thirst?: No  Musculoskeletal Back pain?: No Joint pain?: No  Neurological Headaches?: No Dizziness?: No  Psychologic Depression?: No Anxiety?: No  Physical Exam: BP 115/77   Pulse 81   Ht 5\' 8"  (1.727 m)   Wt 150 lb (68 kg)   BMI 22.81 kg/m   Constitutional:  Well nourished. Alert and oriented, No acute distress. HEENT: [place] AT, moist mucus membranes.  Trachea midline, no masses. Cardiovascular: No clubbing, cyanosis, or edema. Respiratory: Normal respiratory effort, no increased work of breathing. GI: Abdomen is soft, non tender, non distended, no abdominal masses. Liver and spleen not palpable.  No hernias appreciated.  Stool sample for occult testing is not indicated.   Patient may have a small bulge when he presses his abdomen out, but I could not detect a defect in the abdominal wall.   GU: No CVA tenderness.  No bladder fullness or masses.  Patient with circumcised phallus. Urethral meatus is patent.  No penile discharge. No penile lesions or rashes. Scrotum without lesions, cysts, rashes and/or edema.  Testicles are located scrotally bilaterally. No masses are appreciated in the testicles. Left and right epididymis are normal. Rectal: Deferred Skin: No rashes, bruises or suspicious lesions. Lymph: No cervical or inguinal adenopathy. Neurologic: Grossly intact, no focal deficits, moving all 4 extremities. Psychiatric: Normal mood and affect.  Laboratory Data: Lab Results  Component Value Date   WBC 10.0 02/07/2018   HGB 15.4 02/07/2018   HCT 44.8 02/07/2018   MCV 91.8 02/07/2018   PLT 231 02/07/2018    Lab Results  Component Value Date   CREATININE 0.94 02/07/2018    No results found for: PSA  No results found for: TESTOSTERONE  No results found for: HGBA1C  No results found for: TSH  No results found for: CHOL, HDL, CHOLHDL, VLDL, LDLCALC   Lab Results  Component Value Date   AST 18 05/15/2017   Lab Results  Component Value Date   ALT 15 05/15/2017   No components found for: ALKALINEPHOPHATASE No components found for: BILIRUBINTOTAL  No results found for: ESTRADIOL  Urinalysis Negative. See Epic.  I have reviewed the labs.   Pertinent Imaging: Results for JAYLON, LIMBACHER (MRN [id number]) as of 11/15/2018 08:52  Ref. Range 11/15/2018 08:51  Scan Result Unknown 27ml     Assessment & Plan:    1. Left testicular pain  Exam benign today - may have a retractile testicle Will obtain a scrotal ultrasound to assess internal structures and blood flow  GC/Chlamydia, mycoplasma and urine cultures sent   2. History of gross hematuria Will obtain a repeat RUS for surveillance  3. Epigastric pain ? Of  GI ulcer - has an appointment with GI on 11/21/2018  4. Right sided abdominal pain  ? Ventral wall hernia- has an appointment with GI on 11/21/2018 May consider referral to general surgery pending GI evaluation    Return for RUS and scrotal ultrasound report .  These notes generated with voice recognition software. I apologize for typographical errors.  Michiel Cowboy, PA-C  [facility] [address]  [address] [place], [place] [address] [phone]

## 2018-11-15 ENCOUNTER — Other Ambulatory Visit: Payer: Self-pay

## 2018-11-15 ENCOUNTER — Encounter: Payer: Self-pay | Admitting: Urology

## 2018-11-15 ENCOUNTER — Ambulatory Visit (INDEPENDENT_AMBULATORY_CARE_PROVIDER_SITE_OTHER): Payer: BC Managed Care – PPO | Admitting: Urology

## 2018-11-15 ENCOUNTER — Telehealth: Payer: Self-pay | Admitting: Urology

## 2018-11-15 VITALS — BP 115/77 | HR 81 | Ht 68.0 in | Wt 150.0 lb

## 2018-11-15 DIAGNOSIS — R109 Unspecified abdominal pain: Secondary | ICD-10-CM | POA: Diagnosis not present

## 2018-11-15 DIAGNOSIS — Z87448 Personal history of other diseases of urinary system: Secondary | ICD-10-CM

## 2018-11-15 DIAGNOSIS — R1013 Epigastric pain: Secondary | ICD-10-CM

## 2018-11-15 DIAGNOSIS — N50819 Testicular pain, unspecified: Secondary | ICD-10-CM

## 2018-11-15 LAB — URINALYSIS, COMPLETE
Bilirubin, UA: NEGATIVE
Glucose, UA: NEGATIVE
Ketones, UA: NEGATIVE
Leukocytes,UA: NEGATIVE
Nitrite, UA: NEGATIVE
Protein,UA: NEGATIVE
RBC, UA: NEGATIVE
Specific Gravity, UA: 1.02 (ref 1.005–1.030)
Urobilinogen, Ur: 0.2 mg/dL (ref 0.2–1.0)
pH, UA: 7 (ref 5.0–7.5)

## 2018-11-15 LAB — MICROSCOPIC EXAMINATION
Epithelial Cells (non renal): NONE SEEN /hpf (ref 0–10)
RBC: NONE SEEN /hpf (ref 0–2)
WBC, UA: NONE SEEN /hpf (ref 0–5)

## 2018-11-15 LAB — BLADDER SCAN AMB NON-IMAGING

## 2018-11-15 NOTE — Telephone Encounter (Signed)
Would you move his appointment up to a virtual on either Tuesday (06/16) or Thursday (06/18) and have his scrotal US and renal US next week so I can go over the results?

## 2018-11-17 LAB — URINE CULTURE

## 2018-11-18 NOTE — Telephone Encounter (Signed)
App changed emailed Manuela Schwartz to call patient to schedule RUS Could not reach patient to let him know about new app VM was full. Will keep trying   [facility]

## 2018-11-21 ENCOUNTER — Ambulatory Visit (INDEPENDENT_AMBULATORY_CARE_PROVIDER_SITE_OTHER): Payer: BC Managed Care – PPO | Admitting: Gastroenterology

## 2018-11-21 ENCOUNTER — Other Ambulatory Visit: Payer: Self-pay

## 2018-11-21 ENCOUNTER — Encounter: Payer: Self-pay | Admitting: Gastroenterology

## 2018-11-21 DIAGNOSIS — R1084 Generalized abdominal pain: Secondary | ICD-10-CM

## 2018-11-21 DIAGNOSIS — F12988 Cannabis use, unspecified with other cannabis-induced disorder: Secondary | ICD-10-CM | POA: Diagnosis not present

## 2018-11-21 DIAGNOSIS — F1290 Cannabis use, unspecified, uncomplicated: Secondary | ICD-10-CM | POA: Insufficient documentation

## 2018-11-21 LAB — MYCOPLASMA / UREAPLASMA CULTURE
Mycoplasma hominis Culture: NEGATIVE
Ureaplasma urealyticum: NEGATIVE

## 2018-11-21 NOTE — Progress Notes (Signed)
Gastroenterology Consultation  Referring Provider:     Nobie Putnam * Primary Care Physician:  Mikey College, NP Primary Gastroenterologist:  Dr. Allen Norris     Reason for Consultation:     Abdominal pain        HPI:   Carlos Newman is a 18 y.o. y/o male referred for consultation & management of abdominal pain by Dr. Merrilyn Puma, Jerrel Ivory, NP.  This patient comes here today for a report of abdominal pain.  The patient states that the abdominal pain is in the epigastric area and the right middle abdomen close to his flank.  The patient states the pain is worse when he moves and he bends.  He also reports the pain to be worse when he is carrying things.  He reports that he does have the lifting work with [employer].  He also reports that the pain was exacerbated when he was on a fishing trip and trying to catch a big fish.  The patient reported the pain to be a crampy pain that is been going on for many years.  He also reports that he has had hematuria and testicular pain.  The patient was seen by urology and is set up to have a ultrasound of his testicles and kidneys.  There is no nausea vomiting diarrhea or constipation associated with the abdominal pain.  He also reports that the abdominal pain is worse when he wakes up in the morning.  If anything he states that not eating may give him a hollow feeling and the pain to be worse.  The patient also states the right side of his abdomen bulges out at times that he has noticed it to have some skin color changes which he reports to look like a black and blue mark.  Past Medical History:  Diagnosis Date  . Hematuria     Past Surgical History:  Procedure Laterality Date  . FOOT SURGERY     Left   . WISDOM TOOTH EXTRACTION      Prior to Admission medications   Not on File    Family History  Problem Relation Age of Onset  . Hematuria Paternal Aunt   . Healthy Mother   . Healthy Father   . Healthy Sister   . Healthy  Sister      Social History   Tobacco Use  . Smoking status: Current Some Day Smoker    Types: Cigarettes  . Smokeless tobacco: Current User    Types: Chew  Substance Use Topics  . Alcohol use: Yes    Alcohol/week: 0.0 standard drinks    Comment: social  . Drug use: Yes    Types: Marijuana    Allergies as of 11/21/2018 - Review Complete 11/15/2018  Allergen Reaction Noted  . Penicillin g Itching and Other (See Comments) 01/13/2015  . Penicillin v potassium Other (See Comments) 04/28/2015    Review of Systems:    All systems reviewed and negative except where noted in HPI.   Physical Exam:  There were no vitals taken for this visit. No LMP for male patient. General:   Alert,  Well-developed, well-nourished, pleasant and cooperative in NAD Head:  Normocephalic and atraumatic. Eyes:  Sclera clear, no icterus.   Conjunctiva pink. Ears:  Normal auditory acuity. Nose:  No deformity, discharge, or lesions. Mouth:  No deformity or lesions,oropharynx pink & moist. Neck:  Supple; no masses or thyromegaly. Lungs:  Respirations even and unlabored.  Clear throughout to auscultation.  No wheezes, crackles, or rhonchi. No acute distress. Heart:  Regular rate and rhythm; no murmurs, clicks, rubs, or gallops. Abdomen:  Normal bowel sounds.  No bruits.  Soft, reproducible tenderness on one finger palpation while flexing the abdominal wall muscles and non-distended without masses, hepatosplenomegaly or hernias noted.  No guarding or rebound tenderness.  Positive Carnett sign.   Rectal:  Deferred.  Msk:  Symmetrical without gross deformities.  Good, equal movement & strength bilaterally. Pulses:  Normal pulses noted. Extremities:  No clubbing or edema.  No cyanosis. Neurologic:  Alert and oriented x3;  grossly normal neurologically. Skin:  Intact without significant lesions or rashes.  No jaundice. Lymph Nodes:  No significant cervical adenopathy. Psych:  Alert and cooperative. Normal mood  and affect.  Imaging Studies: No results found.  Assessment and Plan:   Carlos Newman is a 18 y.o. y/o male who has had chronic abdominal pain in the epigastric area at the tendon between the rectus abdominis muscles and over the right oblique.  The patient was told that since his abdominal pain was exacerbated by raising his legs above the exam table and reporting that the tenderness was worse that it was likely musculoskeletal whereupon he stated that he was wrong and that it was more painful with his legs down.  He also then reported that he was wrong with describing his muscular pain as crampy and says that it is more constant and he is doubtful that this is musculoskeletal pain.  Despite having no GI symptoms whatsoever associated with the pain the patient has been told that he will be set up for a CT scan of the abdomen and pelvis since his symptoms seem to have changed and I am not able to get a good handle on what his characteristics, exacerbating or relieving symptoms of his pain are.  He has been told that if the CT scan of the abdomen and pelvis are normal that he likely has abdominal muscular pain and it should be treated as such.  This is unlikely inflammatory bowel disease, peptic ulcer disease or irritable bowel syndrome.  Patient has been explained the plan and agrees with it.  Midge Miniumarren Syniah Berne, MD. Clementeen GrahamFACG    Note: This dictation was prepared with Dragon dictation along with smaller phrase technology. Any transcriptional errors that result from this process are unintentional.

## 2018-11-21 NOTE — Patient Instructions (Addendum)
You are scheduled for a CT abdomen/pelvis with contrast at [facility] on Wednesday, June 17th @ 10:30am. Please arrive at 10:15am. You cannot have anything to eat or drink after midnight.   If you need to reschedule this appointment for any reason, please contact central scheduling at [phone].

## 2018-11-22 ENCOUNTER — Other Ambulatory Visit: Payer: Self-pay

## 2018-11-22 ENCOUNTER — Ambulatory Visit
Admission: RE | Admit: 2018-11-22 | Discharge: 2018-11-22 | Disposition: A | Discharge status: Home / Self Care | Payer: BC Managed Care – PPO | Source: Ambulatory Visit | Attending: Urology | Admitting: Urology

## 2018-11-22 DIAGNOSIS — N50819 Testicular pain, unspecified: Secondary | ICD-10-CM | POA: Insufficient documentation

## 2018-11-22 DIAGNOSIS — Z87448 Personal history of other diseases of urinary system: Secondary | ICD-10-CM | POA: Insufficient documentation

## 2018-11-23 LAB — GC/CHLAMYDIA PROBE AMP
Chlamydia trachomatis, NAA: NEGATIVE
Neisseria Gonorrhoeae by PCR: NEGATIVE

## 2018-11-26 ENCOUNTER — Other Ambulatory Visit: Payer: Self-pay

## 2018-11-26 ENCOUNTER — Telehealth (INDEPENDENT_AMBULATORY_CARE_PROVIDER_SITE_OTHER): Payer: BC Managed Care – PPO | Admitting: Urology

## 2018-11-26 DIAGNOSIS — Z87448 Personal history of other diseases of urinary system: Secondary | ICD-10-CM

## 2018-11-26 DIAGNOSIS — N50819 Testicular pain, unspecified: Secondary | ICD-10-CM

## 2018-11-26 DIAGNOSIS — R109 Unspecified abdominal pain: Secondary | ICD-10-CM

## 2018-11-26 NOTE — Progress Notes (Signed)
Virtual Visit via Video Note  I connected with Maude LericheFrederick H Horwitz on 11/26/18 at  1:30 PM EDT by a video enabled telemedicine application and verified that I am speaking with the correct person using two identifiers.  Location: Patient: Home Provider: Home   I discussed the limitations of evaluation and management by telemedicine and the availability of in person appointments. The patient expressed understanding and agreed to proceed.  History of Present Illness: Merlyn AlbertFred is a 18 year old Caucasian with right sided abdominal pain and left testicular pain and a remote history of gross hematuria who is contacted today via [email or web address] to review test results.  His mycoplasma, UA, urine culture and chlamydia/gonorrhea PCR were negative.    Scrotal US and RUS on 11/22/2018 were negative.    He was also evaluated by GI and a CT scan was recommended for further clarifications of his symptoms.  This is pending.    He thinks the left testicular pain may have resulted from a fishing pole as he was trying to lure a fish into the boat.  It is getter better as time goes on.  His right sided abdominal pain persists, but it has not changed in intensity or frequency since we last spoke.    Patient denies any gross hematuria, dysuria or suprapubic/flank pain.  Patient denies any fevers, chills, nausea or vomiting.     Observations/Objective: CLINICAL DATA:  LEFT testicular pain  EXAM: SCROTAL ULTRASOUND  DOPPLER ULTRASOUND OF THE TESTICLES  TECHNIQUE: Complete ultrasound examination of the testicles, epididymis, and other scrotal structures was performed. Color and spectral Doppler ultrasound were also utilized to evaluate blood flow to the testicles.  COMPARISON:  None  FINDINGS: Right testicle  Measurements: 4.7 x 2.4 x 3.6 cm. Normal echogenicity without mass or calcification. Internal blood flow present on color Doppler imaging.  Left testicle  Measurements: 4.7 x 2.9 x 3.5 cm.  Normal echogenicity without mass or calcification. Internal blood flow present on color Doppler imaging.  Right epididymis:  Normal in size and appearance.  Left epididymis:  Normal in size and appearance.  Hydrocele:  None visualized.  Varicocele:  None visualized.  Pulsed Doppler interrogation of both testes demonstrates normal low resistance arterial and venous waveforms bilaterally.  IMPRESSION: Normal exam.   Electronically Signed   By: Ulyses SouthwardMark  Boles M.D.   On: 11/22/2018 16:30  CLINICAL DATA:  Hematuria  EXAM: RENAL / URINARY TRACT ULTRASOUND COMPLETE  COMPARISON:  01/14/2015  FINDINGS: Right Kidney:  Renal measurements: 11.1 x 6.0 x 5.4 cm = volume: 187 mL . Normal cortical thickness and echogenicity. No mass, hydronephrosis or shadowing calcification.  Left Kidney:  Renal measurements: 10.9 x 5.1 x 4.2 cm = volume: 123 mL. Normal cortical thickness and echogenicity. No mass, hydronephrosis or shadowing calcification.  Bladder:  Appears normal for degree of bladder distention.  IMPRESSION: Normal exam.   Electronically Signed   By: Ulyses SouthwardMark  Boles M.D.   On: 11/22/2018 16:31  Assessment and Plan:  1. Right sided abdominal pain RUS was normal  Encouraged Fred to go through with the contrast CT as it may give as more information regarding his pain He will call and get it rescheduled  2. Left testicular pain Scrotal ultrasound was normal Likely to do MSK trauma    Follow Up Instructions:  Merlyn AlbertFred will follow up pending the CT scan results.   I discussed the assessment and treatment plan with the patient. The patient was provided an opportunity to ask questions and all  were answered. The patient agreed with the plan and demonstrated an understanding of the instructions.   The patient was advised to call back or seek an in-person evaluation if the symptoms worsen or if the condition fails to improve as anticipated.  I provided 7  minutes of non-face-to-face time during this encounter.   Shericka Johnstone, PA-C

## 2018-11-27 ENCOUNTER — Ambulatory Visit: Admission: RE | Admit: 2018-11-27 | Payer: BC Managed Care – PPO | Source: Ambulatory Visit

## 2018-12-02 ENCOUNTER — Ambulatory Visit: Admitting: Urology

## 2018-12-02 ENCOUNTER — Encounter: Payer: Self-pay | Admitting: Family Medicine

## 2018-12-18 ENCOUNTER — Telehealth: Payer: Self-pay | Admitting: Urology

## 2018-12-18 NOTE — Telephone Encounter (Signed)
Would you call Carlos Newman and see if he is planning to have his CT scan?

## 2018-12-19 NOTE — Telephone Encounter (Signed)
I called him and no answer his VM is full. Just FYI

## 2019-07-01 ENCOUNTER — Ambulatory Visit
Admission: RE | Admit: 2019-07-01 | Discharge: 2019-07-01 | Disposition: A | Discharge status: Home / Self Care | Payer: BC Managed Care – PPO | Source: Ambulatory Visit | Attending: Gastroenterology | Admitting: Gastroenterology

## 2019-07-01 ENCOUNTER — Other Ambulatory Visit: Payer: Self-pay

## 2019-07-01 DIAGNOSIS — R1084 Generalized abdominal pain: Secondary | ICD-10-CM | POA: Insufficient documentation

## 2019-07-01 DIAGNOSIS — R109 Unspecified abdominal pain: Secondary | ICD-10-CM | POA: Diagnosis not present

## 2019-07-01 MED ORDER — IOHEXOL 300 MG/ML  SOLN
100.0000 mL | Freq: Once | INTRAMUSCULAR | Status: AC | PRN
  Administered 2019-07-01: 100 mL via INTRAVENOUS

## 2019-07-04 ENCOUNTER — Telehealth: Payer: Self-pay

## 2019-07-04 NOTE — Telephone Encounter (Signed)
-----   Message from Midge Minium, MD sent at 07/03/2019 12:30 PM EST ----- Let the patient know that the CT scan was absolutely normal for anything in the abdomen as the cause of his symptoms.  It is likely related to muscle strain/pain since it is worse when he was fishing and tried to catch a big fish and when he was using his abdominal wall muscles.

## 2019-07-04 NOTE — Telephone Encounter (Signed)
Sent mychart message regarding CT scan results.

## 2019-07-30 ENCOUNTER — Encounter: Payer: Self-pay | Admitting: Urology

## 2019-07-30 ENCOUNTER — Other Ambulatory Visit: Payer: Self-pay

## 2019-07-30 ENCOUNTER — Ambulatory Visit (INDEPENDENT_AMBULATORY_CARE_PROVIDER_SITE_OTHER): Payer: BC Managed Care – PPO | Admitting: Urology

## 2019-07-30 VITALS — BP 143/88 | HR 120 | Ht 69.0 in | Wt 160.0 lb

## 2019-07-30 DIAGNOSIS — R3129 Other microscopic hematuria: Secondary | ICD-10-CM | POA: Diagnosis not present

## 2019-07-30 DIAGNOSIS — N50812 Left testicular pain: Secondary | ICD-10-CM | POA: Diagnosis not present

## 2019-07-30 LAB — URINALYSIS, COMPLETE
Bilirubin, UA: NEGATIVE
Glucose, UA: NEGATIVE
Ketones, UA: NEGATIVE
Leukocytes,UA: NEGATIVE
Nitrite, UA: NEGATIVE
Protein,UA: NEGATIVE
Specific Gravity, UA: 1.01 (ref 1.005–1.030)
Urobilinogen, Ur: 0.2 mg/dL (ref 0.2–1.0)
pH, UA: 6.5 (ref 5.0–7.5)

## 2019-07-30 LAB — MICROSCOPIC EXAMINATION
Bacteria, UA: NONE SEEN
Epithelial Cells (non renal): NONE SEEN /hpf (ref 0–10)

## 2019-07-31 ENCOUNTER — Ambulatory Visit: Payer: BC Managed Care – PPO | Admitting: Urology

## 2019-08-06 ENCOUNTER — Ambulatory Visit
Admission: RE | Admit: 2019-08-06 | Discharge: 2019-08-06 | Disposition: A | Discharge status: Home / Self Care | Payer: BC Managed Care – PPO | Source: Ambulatory Visit | Attending: Urology | Admitting: Urology

## 2019-08-06 ENCOUNTER — Other Ambulatory Visit: Payer: Self-pay

## 2019-08-06 ENCOUNTER — Telehealth: Payer: Self-pay | Admitting: Nurse Practitioner

## 2019-08-06 DIAGNOSIS — N50812 Left testicular pain: Secondary | ICD-10-CM | POA: Diagnosis not present

## 2019-08-06 NOTE — Telephone Encounter (Signed)
Pt wanted to know if he ws current on his immunization.  Pt  Call back # is  [phone]

## 2019-08-07 NOTE — Progress Notes (Signed)
07/30/2019 10:56 AM   Carlos Newman 06-24-2000 [id number]  Referring provider: No referring provider defined for this encounter.  Chief Complaint  Patient presents with  . Testicle Pain    HPI: Carlos Newman is a 19 year old male who returns today to revisit his left testicular pain.   At his visit on 11/15/2018, he noted that his left testicle was rising and falling within the scrotal sac with his breathing and associated with a dull pain.  He denied the testicle rising into the inguinal canal.  He had unprotected sex about one month ago.  He had not had any penile discharge or penile burning.  He has not noted any difficulty or pain with his erections.  He did not have any curvature with his erections.  He was still having spontaneous erections and has a good libido.  He did not have pain with ejaculation.    Scrotal ultrasound 11/22/2018 was normal.  RUS 11/22/2018 was normal.    He was then evaluated by GI and pain was determined to muscle skeletal.  He presents today with the same complaint concerning his left testicle.  He states the pain has never abated and he is concerned that he may have testicular cancer as one of his friends was diagnosed with testicular cancer a few years ago.  As before, he denies any penile discharge.  He is not having difficulty with erections, pain with erections or curvature with his erections.  He is not having pain with ejaculation.  He has not had any trauma to the testicle.  Patient denies any modifying or aggravating factors.  Patient denies any gross hematuria, dysuria or suprapubic/flank pain.  Patient denies any fevers, chills, nausea or vomiting.   His UA is yellow clear, specific gravity 1.010, 1+ blood, pH 6.5, 0-5 WBCs and 3-10 RBCs.    Contrast CT 07/01/2019 bilateral kidneys and adrenal glands are normal in appearance. No hydroureteronephrosis. Urinary bladder is normal in appearance.  Prostate gland and seminal vesicles are  unremarkable in appearance.  PMH: Past Medical History:  Diagnosis Date  . Hematuria     Surgical History: Past Surgical History:  Procedure Laterality Date  . FOOT SURGERY     Left   . WISDOM TOOTH EXTRACTION      Home Medications:  Allergies as of 07/30/2019      Reactions   Penicillin G Itching, Other (See Comments)   Penicillin V Potassium Other (See Comments)      Medication List    as of July 30, 2019 11:59 PM   You have not been prescribed any medications.     Allergies:  Allergies  Allergen Reactions  . Penicillin G Itching and Other (See Comments)  . Penicillin V Potassium Other (See Comments)    Family History: Family History  Problem Relation Age of Onset  . Hematuria Paternal Aunt   . Healthy Mother   . Healthy Father   . Healthy Sister   . Healthy Sister     Social History:  reports that he has been smoking cigarettes. His smokeless tobacco use includes chew. He reports current alcohol use. He reports current drug use. Drug: Marijuana.  ROS: Pertinent review of systems please refer to history of present illness  Physical Exam: BP (!) 143/88   Pulse (!) 120   Ht 5\' 9"  (1.753 m)   Wt 160 lb (72.6 kg)   BMI 23.63 kg/m   Constitutional:  Well nourished. Alert and oriented, No  acute distress. HEENT: [place] AT, mask in place.  Trachea midline, no masses. Cardiovascular: No clubbing, cyanosis, or edema. Respiratory: Normal respiratory effort, no increased work of breathing. GI: Abdomen is soft, non tender, non distended, no abdominal masses.  GU: No CVA tenderness.  No bladder fullness or masses.  Patient with circumcised phallus. Urethral meatus is patent.  No penile discharge. No penile lesions or rashes. Scrotum without lesions, cysts, rashes and/or edema.  Testicles are located scrotally bilaterally. No masses are appreciated in the testicles. Left and right epididymis are normal. Rectal: Deferred Skin: No rashes, bruises or suspicious  lesions. Lymph: No inguinal adenopathy. Neurologic: Grossly intact, no focal deficits, moving all 4 extremities. Psychiatric: Normal mood and affect.   Laboratory Data: Lab Results  Component Value Date   WBC 10.0 02/07/2018   HGB 15.4 02/07/2018   HCT 44.8 02/07/2018   MCV 91.8 02/07/2018   PLT 231 02/07/2018    Lab Results  Component Value Date   CREATININE 0.94 02/07/2018    Lab Results  Component Value Date   AST 18 05/15/2017   Lab Results  Component Value Date   ALT 15 05/15/2017    Urinalysis   Component     Latest Ref Rng & Units 07/30/2019  Specific Gravity, UA     1.005 - 1.030 1.010  pH, UA     5.0 - 7.5 6.5  Color, UA     Yellow Yellow  Appearance Ur     Clear Clear  Leukocytes,UA     Negative Negative  Protein,UA     Negative/Trace Negative  Glucose, UA     Negative Negative  Ketones, UA     Negative Negative  RBC, UA     Negative 1+ (A)  Bilirubin, UA     Negative Negative  Urobilinogen, Ur     0.2 - 1.0 mg/dL 0.2  Nitrite, UA     Negative Negative  Microscopic Examination      See below:   Component     Latest Ref Rng & Units 07/30/2019  WBC, UA     0 - 5 /hpf 0-5  RBC     0 - 2 /hpf 3-10 (A)  Epithelial Cells (non renal)     0 - 10 /hpf None seen  Bacteria, UA     None seen/Few None seen   I have reviewed the labs.   Pertinent Imaging: CLINICAL DATA:  19 year old male with history of left testicular pain worsening since last summer. Upper mid right abdominal pain for the past 2 years. Intermittent gross hematuria. Suspected diverticulitis.  EXAM: CT ABDOMEN AND PELVIS WITH CONTRAST  TECHNIQUE: Multidetector CT imaging of the abdomen and pelvis was performed using the standard protocol following bolus administration of intravenous contrast.  CONTRAST:  OMNIPAQUE IOHEXOL 300 MG/ML  SOLN  COMPARISON:  No priors.  FINDINGS: Lower chest: Unremarkable.  Hepatobiliary: No suspicious cystic or solid  hepatic lesions. No intra or extrahepatic biliary ductal dilatation. Gallbladder is normal in appearance.  Pancreas: No pancreatic mass. No pancreatic ductal dilatation. No pancreatic or peripancreatic fluid collections or inflammatory changes.  Spleen: Unremarkable.  Adrenals/Urinary Tract: Bilateral kidneys and adrenal glands are normal in appearance. No hydroureteronephrosis. Urinary bladder is normal in appearance.  Stomach/Bowel: Normal appearance of the stomach. No pathologic dilatation of small bowel or colon. Normal appendix.  Vascular/Lymphatic: No significant atherosclerotic disease, aneurysm or dissection noted in the abdominal or pelvic vasculature. No lymphadenopathy noted in the abdomen or pelvis.  Reproductive: Prostate gland and seminal vesicles are unremarkable in appearance.  Other: No significant volume of ascites.  No pneumoperitoneum.  Musculoskeletal: There are no aggressive appearing lytic or blastic lesions noted in the visualized portions of the skeleton.  IMPRESSION: 1. No acute findings are noted in the abdomen or pelvis to account for the patient's symptoms.   Electronically Signed   By: Trudie Reed M.D.   On: 07/01/2019 10:15 I have independently reviewed the films and do not identify an etiology for his testicular pain or microscopic hematuria.     Assessment & Plan:    1. Left testicular pain  Exam again benign today - may have a retractile testicle Will obtain a repeat scrotal ultrasound to assess internal structures and blood flow - explained to the patient that the repeat ultrasound is likely of low yield, but as he is concerned for testicular cancer it may ease his mind  2. Microscopic hematuria Contrast CT 07/01/2019 NED Scrotal ultrasound is pending  3. Epigastric pain Evaluated by GI - no GI etiology found  4. Right sided abdominal pain  Evaluated by GI - no GI etiology found   Return in about 1 week  (around 08/06/2019) for scrotal ultrasound report .  These notes generated with voice recognition software. I apologize for typographical errors.  Michiel Cowboy, PA-C  [facility] [address]  [address] [place], [place] [address] [phone]

## 2019-08-11 ENCOUNTER — Ambulatory Visit: Payer: BC Managed Care – PPO | Admitting: Family Medicine

## 2019-08-12 ENCOUNTER — Ambulatory Visit: Payer: BC Managed Care – PPO | Admitting: Urology

## 2019-09-01 ENCOUNTER — Encounter: Payer: BC Managed Care – PPO | Admitting: Family Medicine

## 2019-09-09 ENCOUNTER — Encounter: Payer: Self-pay | Admitting: Urology

## 2019-09-09 ENCOUNTER — Other Ambulatory Visit: Payer: Self-pay

## 2019-09-09 ENCOUNTER — Ambulatory Visit (INDEPENDENT_AMBULATORY_CARE_PROVIDER_SITE_OTHER): Payer: BC Managed Care – PPO | Admitting: Urology

## 2019-09-09 VITALS — BP 125/84 | HR 82 | Ht 68.0 in | Wt 160.0 lb

## 2019-09-09 DIAGNOSIS — R3129 Other microscopic hematuria: Secondary | ICD-10-CM

## 2019-09-09 DIAGNOSIS — N50812 Left testicular pain: Secondary | ICD-10-CM

## 2019-09-09 LAB — URINALYSIS, COMPLETE
Bilirubin, UA: NEGATIVE
Glucose, UA: NEGATIVE
Ketones, UA: NEGATIVE
Leukocytes,UA: NEGATIVE
Nitrite, UA: NEGATIVE
Protein,UA: NEGATIVE
Specific Gravity, UA: 1.025 (ref 1.005–1.030)
Urobilinogen, Ur: 0.2 mg/dL (ref 0.2–1.0)
pH, UA: 6.5 (ref 5.0–7.5)

## 2019-09-09 LAB — MICROSCOPIC EXAMINATION
Bacteria, UA: NONE SEEN
Epithelial Cells (non renal): NONE SEEN /hpf (ref 0–10)
WBC, UA: NONE SEEN /hpf (ref 0–5)

## 2019-09-09 IMAGING — DX DG LUMBAR SPINE COMPLETE 4+V
5 series · 5 of 5 positions shown · non-contrast
Comparison: No recent prior.

CLINICAL DATA: Low back pain.

EXAM:
LUMBAR SPINE - COMPLETE 4+ VIEW

[l-spine obl (1 of 2)]
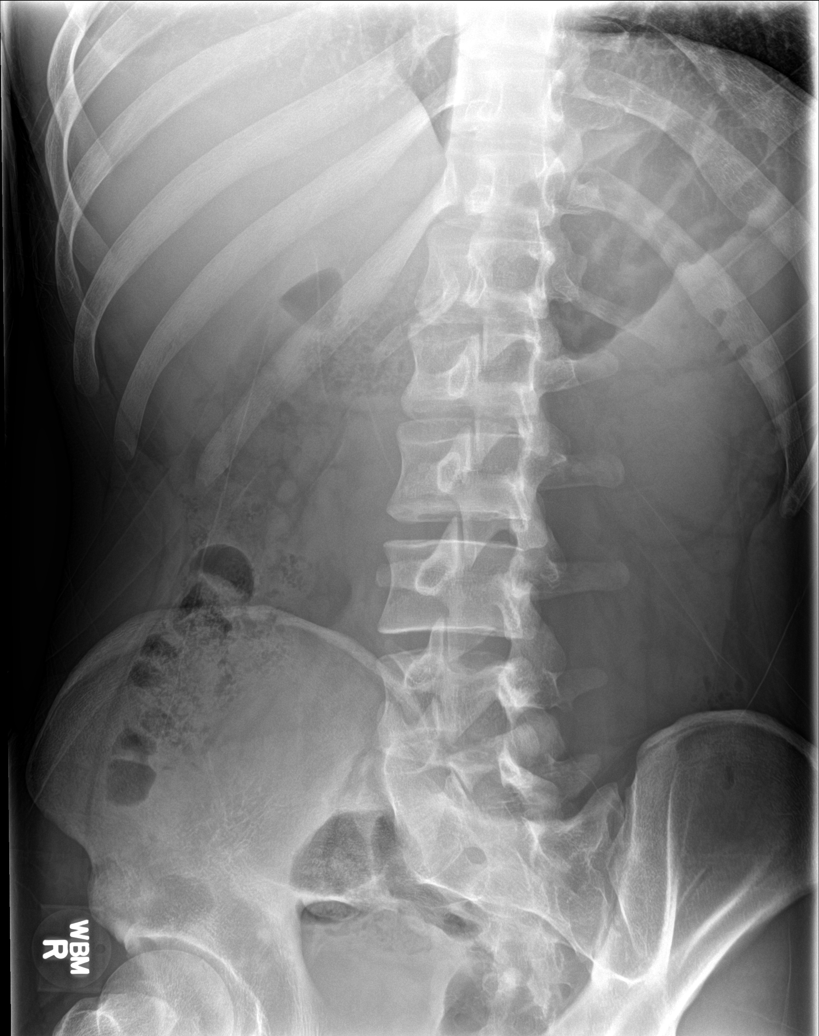

[l-spine obl (2 of 2)]
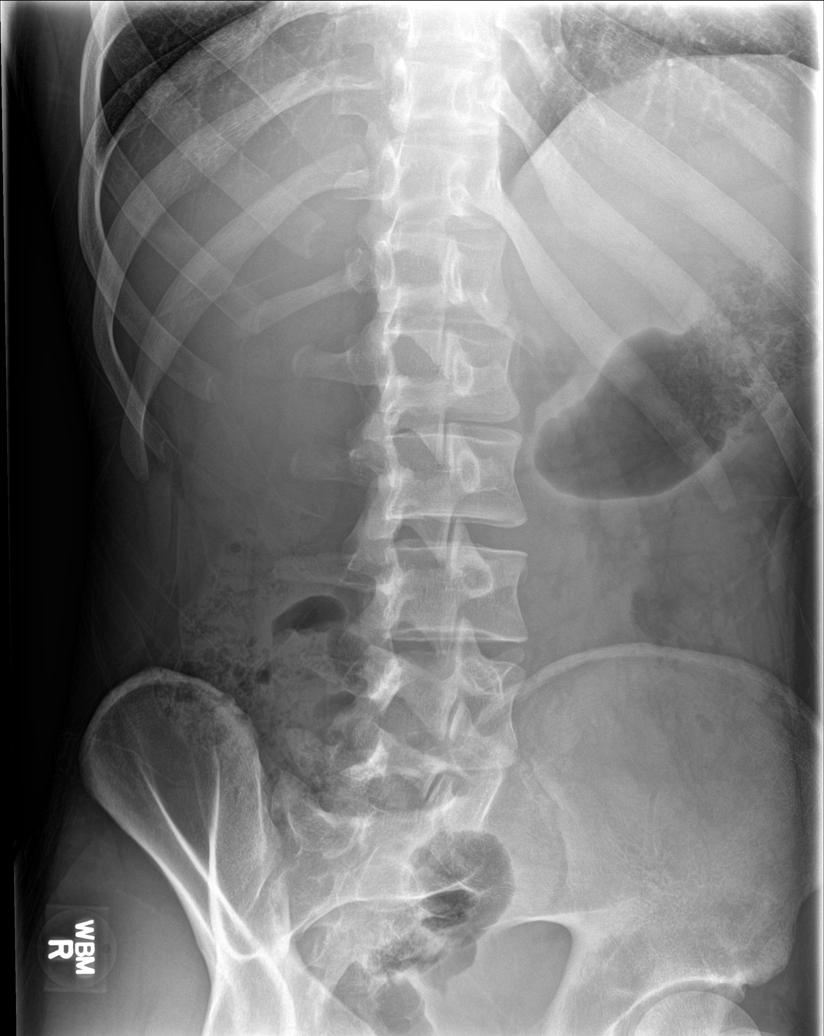

[l-spine lat]
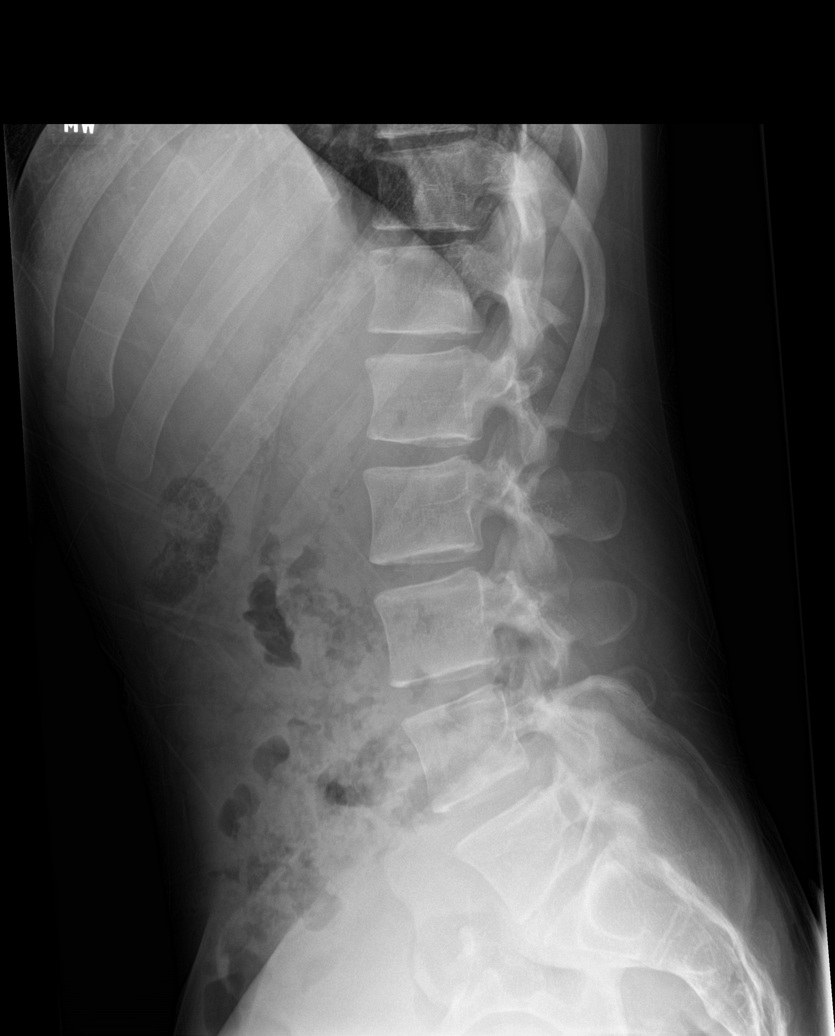

[l-spine ap]
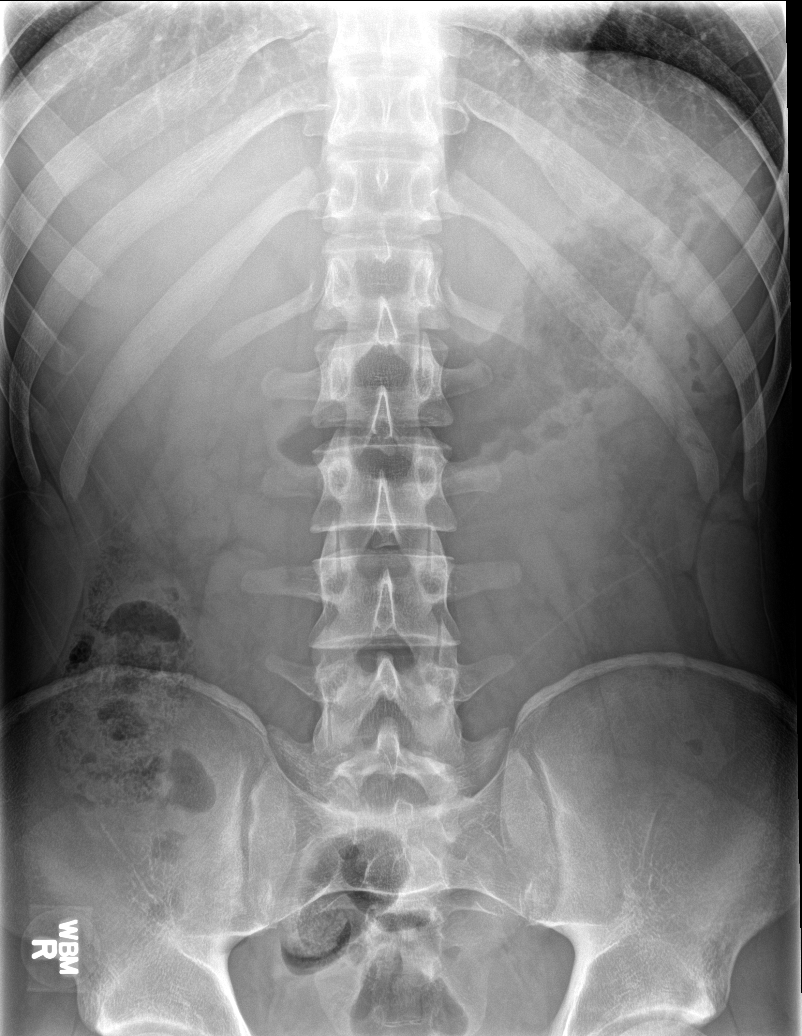

[l-spine spot]
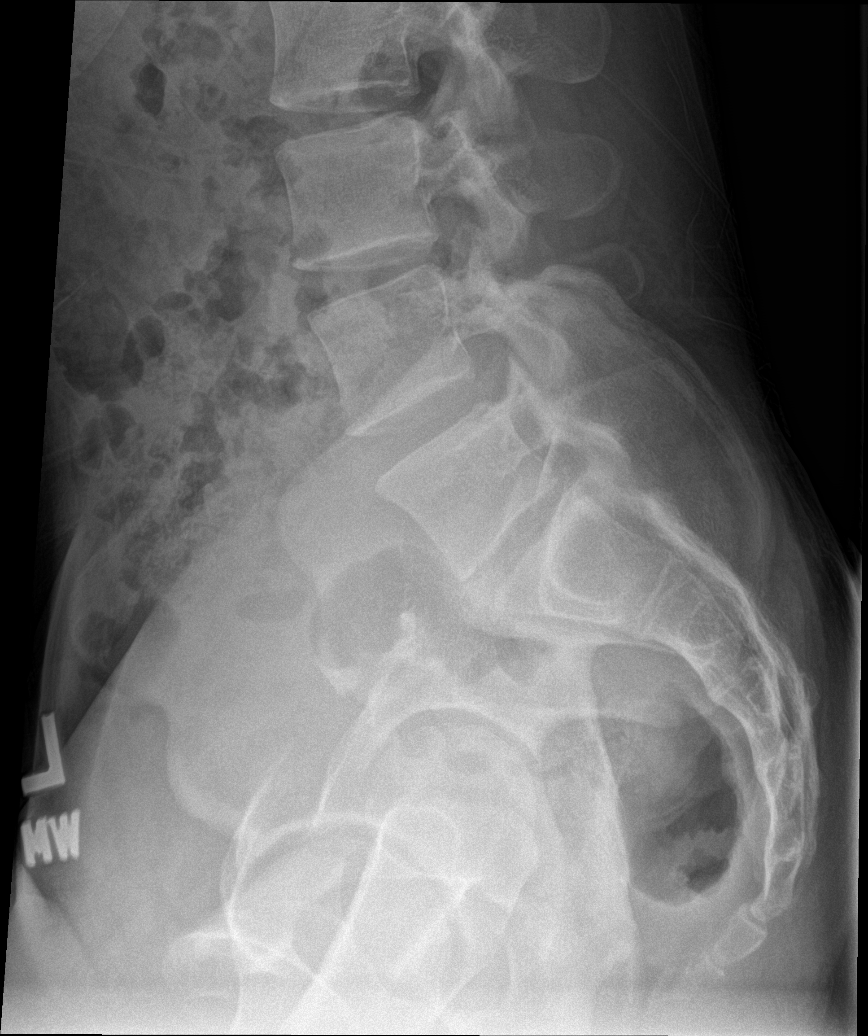

[5 of 5 positions shown; findings below may reference images not displayed]

FINDINGS: Soft tissue structures are unremarkable. No bowel distention. Stool
noted throughout the colon. No acute bony abnormality identified.
Normal bony alignment. No evidence of fracture .
IMPRESSION: No acute or focal bony abnormality identified.

## 2019-09-09 NOTE — Progress Notes (Signed)
09/09/2019 3:14 PM   Carlos Newman 05-13-2001 [id number]  Referring provider: No referring provider defined for this encounter.  Chief Complaint  Patient presents with  . Follow-up    HPI: [name] is a 19 year old male who returns today to review his scrotal ultrasound.  At his visit on 11/15/2018, he noted that his left testicle was rising and falling within the scrotal sac with his breathing and associated with a dull pain.  He denied the testicle rising into the inguinal canal.  He had unprotected sex about one month ago.  He had not had any penile discharge or penile burning.  He has not noted any difficulty or pain with his erections.  He did not have any curvature with his erections.  He was still having spontaneous erections and has a good libido.  He did not have pain with ejaculation.   Scrotal ultrasound 11/22/2018 was normal.  RUS 11/22/2018 was normal.   He was then evaluated by GI and pain was determined to muscle skeletal.  Contrast CT 07/01/2019 bilateral kidneys and adrenal glands are normal in appearance. No hydroureteronephrosis. Urinary bladder is normal in appearance.  Prostate gland and seminal vesicles are unremarkable in appearance.  At his visit on 07/30/2019, he presented with the same complaint concerning his left testicle.  He stated the pain had never abated and he was concerned that he may have testicular cancer as one of his friends was diagnosed with testicular cancer a few years ago.  As before, he denied any penile discharge.  He was not having difficulty with erections, pain with erections or curvature with his erections.  He was not having pain with ejaculation.  He has not had any trauma to the testicle.  Patient denies any modifying or aggravating factors.  Patient denies any gross hematuria, dysuria or suprapubic/flank pain. Patient denies any fevers, chills, nausea or vomiting.   His UA was yellow clear, specific gravity 1.010, 1+ blood,  pH 6.5, 0-5 WBCs and 3-10 RBCs.    Scrotal ultrasound 08/06/2019 was normal.    Today, his symptoms have lessened.  His UA is yellow clear, specific gravity 1.025, 1+ blood, pH 6.5 and 11-30 RBCs.  Patient denies any modifying or aggravating factors.  Patient denies any gross hematuria, dysuria or suprapubic/flank pain.  Patient denies any fevers, chills, nausea or vomiting.     He states that his left testicle rises when he strains.  He can demonstrate this to me.  He states he has been smoking since the age of 22.  Currently, he is vaping mostly.  He states he smokes 1 to 2 cigarettes daily and 3 marijuana cigarettes daily.   PMH: Past Medical History:  Diagnosis Date  . Hematuria     Surgical History: Past Surgical History:  Procedure Laterality Date  . FOOT SURGERY     Left   . WISDOM TOOTH EXTRACTION      Home Medications:  Allergies as of 09/09/2019      Reactions   Penicillin G Itching, Other (See Comments)   Penicillin V Potassium Other (See Comments)      Medication List    as of September 09, 2019 11:59 PM   You have not been prescribed any medications.     Allergies:  Allergies  Allergen Reactions  . Penicillin G Itching and Other (See Comments)  . Penicillin V Potassium Other (See Comments)    Family History: Family History  Problem Relation Age of Onset  .  Hematuria Paternal Aunt   . Healthy Mother   . Healthy Father   . Healthy Sister   . Healthy Sister     Social History:  reports that he has been smoking cigarettes. His smokeless tobacco use includes chew. He reports current alcohol use. He reports current drug use. Drug: Marijuana.  ROS: Pertinent review of systems please refer to history of present illness  Physical Exam: BP 125/84   Pulse 82   Ht 5\' 8"  (1.727 m)   Wt 160 lb (72.6 kg)   BMI 24.33 kg/m   Constitutional:  Well nourished. Alert and oriented, No acute distress. HEENT: [place] AT, mask in place.  Trachea midline, no  masses. Cardiovascular: No clubbing, cyanosis, or edema. Respiratory: Normal respiratory effort, no increased work of breathing. GI: Abdomen is soft, non tender, non distended, no abdominal masses.  GU: No CVA tenderness.  No bladder fullness or masses.  Patient with circumcised phallus.  Urethral meatus is patent.  No penile discharge. No penile lesions or rashes. Scrotum without lesions, cysts, rashes and/or edema.  Testicles are located scrotally bilaterally. No masses are appreciated in the testicles. Left and right epididymis are normal.  Testicles did not rise into the inguinal canal with Valsalva.  Normal cremasteric reflexes.   Rectal: Not performed Skin: No rashes, bruises or suspicious lesions. Lymph: No inguinal adenopathy. Neurologic: Grossly intact, no focal deficits, moving all 4 extremities. Psychiatric: Normal mood and affect.    Laboratory Data: Lab Results  Component Value Date   WBC 10.0 02/07/2018   HGB 15.4 02/07/2018   HCT 44.8 02/07/2018   MCV 91.8 02/07/2018   PLT 231 02/07/2018    Lab Results  Component Value Date   CREATININE 0.94 02/07/2018    Lab Results  Component Value Date   AST 18 05/15/2017   Lab Results  Component Value Date   ALT 15 05/15/2017    Urinalysis   Component     Latest Ref Rng & Units 09/09/2019  Specific Gravity, UA     1.005 - 1.030 1.025  pH, UA     5.0 - 7.5 6.5  Color, UA     Yellow Yellow  Appearance Ur     Clear Clear  Leukocytes,UA     Negative Negative  Protein,UA     Negative/Trace Negative  Glucose, UA     Negative Negative  Ketones, UA     Negative Negative  RBC, UA     Negative 1+ (A)  Bilirubin, UA     Negative Negative  Urobilinogen, Ur     0.2 - 1.0 mg/dL 0.2  Nitrite, UA     Negative Negative  Microscopic Examination      See below:   Component     Latest Ref Rng & Units 09/09/2019  WBC, UA     0 - 5 /hpf None seen  RBC     0 - 2 /hpf 11-30 (A)  Epithelial Cells (non renal)     0 -  10 /hpf None seen  Bacteria, UA     None seen/Few None seen   I have reviewed the labs.   Pertinent Imaging: CLINICAL DATA:  Left testicular pain for 1 year.  EXAM: SCROTAL ULTRASOUND  DOPPLER ULTRASOUND OF THE TESTICLES  TECHNIQUE: Complete ultrasound examination of the testicles, epididymis, and other scrotal structures was performed. Color and spectral Doppler ultrasound were also utilized to evaluate blood flow to the testicles.  COMPARISON:  Scrotal ultrasound 11/22/2018.  FINDINGS:  Right testicle  Measurements: 5.0 x 2.6 x 3.2 cm. No mass or microlithiasis visualized.  Left testicle  Measurements: 4.9 x 2.5 x 3.1 cm. No mass or microlithiasis visualized.  Right epididymis:  Normal in size and appearance.  Left epididymis:  Normal in size and appearance.  Hydrocele:  None visualized.  Varicocele:  None visualized.  Pulsed Doppler interrogation of both testes demonstrates normal low resistance arterial and venous waveforms bilaterally.  IMPRESSION: Normal examination.   Electronically Signed   By: Inge Rise M.D.   On: 08/06/2019 13:56 I have independently reviewed the films and the ultrasound is normal.     Assessment & Plan:    1. Left testicular pain  Scrotal ultrasounds x 2 - normal  Contrast CT 06/2019 - negative Cystoscopy is pending, but will not likely demonstrate an etiology for this pain Would consider PT if cysto is negative  2. Microscopic hematuria Contrast CT 07/01/2019 NED Scrotal ultrasounds x 2 benign  UA today with 11-30 RBC's Urine sent for culture and GC/chlamydia and mycoplasma tests  Will schedule a cystoscopy at this time for further evaluation for his persistent micro heme I have explained to the patient that they will  be scheduled for a cystoscopy in our office to evaluate their bladder.  The cystoscopy consists of passing a tube with a lens up through their urethra and into their urinary  bladder.   We will inject the urethra with a lidocaine gel prior to introducing the cystoscope to help with any discomfort during the procedure.   After the procedure, they might experience blood in the urine and discomfort with urination.  This will abate after the first few voids.  I have  encouraged the patient to increase water intake  during this time.  Patient denies any allergies to lidocaine.  I have prescribed a Valium and advised the patient to have a driver to and from his appointment   3. Epigastric pain Evaluated by GI - no GI etiology found  4. Right sided abdominal pain  Evaluated by GI - no GI etiology found   Return for cystosocpy for hematuria .  These notes generated with voice recognition software. I apologize for typographical errors.  Zara Council, PA-C  [facility] [address]  [place], [place] [address] [phone]

## 2019-09-10 ENCOUNTER — Telehealth: Payer: Self-pay | Admitting: Urology

## 2019-09-10 LAB — GC/CHLAMYDIA PROBE AMP
Chlamydia trachomatis, NAA: NEGATIVE
Neisseria Gonorrhoeae by PCR: NEGATIVE

## 2019-09-10 MED ORDER — DIAZEPAM 10 MG PO TABS: ORAL_TABLET | ORAL | 0 refills | Status: DC

## 2019-09-10 NOTE — Telephone Encounter (Signed)
Patient notified and scheduled the appointment. He does want a valium. He will have a driver.

## 2019-09-10 NOTE — Telephone Encounter (Signed)
Please let Carlos Newman know I have sent the Valium prescription to the [facility] in [place].  He needs to take the tablet 30 minutes prior to the cystoscopic appointment and of course have a driver.

## 2019-09-10 NOTE — Telephone Encounter (Signed)
Would you call Carlos Newman and let him know that the it is recommended that he have the cystoscopy?  We can prescribe him Valium prior to the appointment, but he will need a driver.

## 2019-09-10 NOTE — Telephone Encounter (Signed)
Patient notified

## 2019-09-12 LAB — CULTURE, URINE COMPREHENSIVE

## 2019-09-16 ENCOUNTER — Telehealth: Payer: Self-pay | Admitting: Family Medicine

## 2019-09-16 LAB — MYCOPLASMA / UREAPLASMA CULTURE
Mycoplasma hominis Culture: NEGATIVE
Ureaplasma urealyticum: POSITIVE — AB

## 2019-09-16 MED ORDER — MINOCYCLINE HCL 100 MG PO CAPS: 100.0000 mg | ORAL_CAPSULE | Freq: Two times a day (BID) | ORAL | 0 refills | Status: DC

## 2019-09-16 NOTE — Telephone Encounter (Signed)
Patient notified and voiced understanding. He will call our office back to schedule a Cysto and recheck the urine.  ABX have been sent to the pharmacy.

## 2019-09-16 NOTE — Telephone Encounter (Signed)
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  We need to treat with minocycline 100 mg BID x 7 days and then recheck his urine.  I still feel we need to go forward with the cystoscopy as he has had blood in his urine for quite some time.

## 2019-10-01 ENCOUNTER — Encounter: Payer: Self-pay | Admitting: Urology

## 2019-10-01 ENCOUNTER — Other Ambulatory Visit: Payer: Self-pay

## 2019-10-01 ENCOUNTER — Ambulatory Visit (INDEPENDENT_AMBULATORY_CARE_PROVIDER_SITE_OTHER): Payer: BC Managed Care – PPO | Admitting: Urology

## 2019-10-01 VITALS — BP 124/79 | HR 102 | Ht 68.0 in | Wt 160.0 lb

## 2019-10-01 DIAGNOSIS — R3129 Other microscopic hematuria: Secondary | ICD-10-CM

## 2019-10-01 NOTE — Progress Notes (Signed)
   10/01/19  CC:  Chief Complaint  Patient presents with  . Cysto    Indications: Microhematuria  HPI: Followed by Carollee Herter for chronic scrotal pain and microhematuria.  Followed by GI for chronic abdominal pain Negative scrotal ultrasound, renal ultrasound and CT abdomen pelvis with contrast.  UA today negative RBCs  Blood pressure 124/79, pulse (!) 102, height 5\' 8"  (1.727 m), weight 160 lb (72.6 kg). NED. A&Ox3.   No respiratory distress   Abd soft, NT, ND Normal phallus with bilateral descended testicles  Cystoscopy Procedure Note  Patient identification was confirmed, informed consent was obtained, and patient was prepped using Betadine solution.  Lidocaine jelly was administered per urethral meatus.     Pre-Procedure: - Inspection reveals a normal caliber urethral meatus.  Procedure: The flexible cystoscope was introduced without difficulty - No urethral strictures/lesions are present. - Nonocclusive prostate, mild hyperemia - Normal bladder neck - Bilateral ureteral orifices identified - Bladder mucosa  reveals no ulcers, tumors, or lesions - No bladder stones - No trabeculation  Retroflexion shows no abnormalities   Post-Procedure: - Patient tolerated the procedure well  Assessment/ Plan: -Unremarkable cystoscopy -Follow-up Shannon 3 months repeat urinalysis   , MD

## 2019-10-02 LAB — URINALYSIS, COMPLETE
Bilirubin, UA: NEGATIVE
Glucose, UA: NEGATIVE
Ketones, UA: NEGATIVE
Leukocytes,UA: NEGATIVE
Nitrite, UA: NEGATIVE
Protein,UA: NEGATIVE
RBC, UA: NEGATIVE
Specific Gravity, UA: 1.015 (ref 1.005–1.030)
Urobilinogen, Ur: 0.2 mg/dL (ref 0.2–1.0)
pH, UA: 6.5 (ref 5.0–7.5)

## 2019-10-02 LAB — MICROSCOPIC EXAMINATION
Bacteria, UA: NONE SEEN
Epithelial Cells (non renal): NONE SEEN /hpf (ref 0–10)
RBC, Urine: NONE SEEN /hpf (ref 0–2)

## 2019-10-06 ENCOUNTER — Other Ambulatory Visit: Payer: Self-pay | Admitting: Urology

## 2019-10-06 DIAGNOSIS — R3129 Other microscopic hematuria: Secondary | ICD-10-CM

## 2019-10-13 NOTE — Telephone Encounter (Signed)
Error

## 2019-11-14 ENCOUNTER — Ambulatory Visit (INDEPENDENT_AMBULATORY_CARE_PROVIDER_SITE_OTHER): Payer: BC Managed Care – PPO | Admitting: Family Medicine

## 2019-11-14 ENCOUNTER — Encounter: Payer: Self-pay | Admitting: Family Medicine

## 2019-11-14 ENCOUNTER — Other Ambulatory Visit: Payer: Self-pay

## 2019-11-14 VITALS — BP 112/49 | HR 100 | Temp 97.3°F | Ht 67.0 in | Wt 158.6 lb

## 2019-11-14 DIAGNOSIS — Z23 Encounter for immunization: Secondary | ICD-10-CM

## 2019-11-14 DIAGNOSIS — Z0000 Encounter for general adult medical examination without abnormal findings: Secondary | ICD-10-CM

## 2019-11-14 LAB — POCT URINALYSIS DIPSTICK
Bilirubin, UA: NEGATIVE
Blood, UA: NEGATIVE
Glucose, UA: NEGATIVE
Ketones, UA: NEGATIVE
Leukocytes, UA: NEGATIVE
Nitrite, UA: NEGATIVE
Protein, UA: NEGATIVE
Spec Grav, UA: <=1.005 — AB (ref 1.010–1.025)
Urobilinogen, UA: 0.2 E.U./dL
pH, UA: 5 (ref 5.0–8.0)

## 2019-11-14 NOTE — Assessment & Plan Note (Signed)
Annual physical exam without new findings.  Well adult with no acute concerns.  Plan: 1. Obtain health maintenance screenings as above according to age. - Increase physical activity to 30 minutes most days of the week.  - Eat healthy diet high in vegetables and fruits; low in refined carbohydrates. - Screening labs and tests as ordered 2. Return 1 year for annual physical.  

## 2019-11-14 NOTE — Progress Notes (Signed)
Subjective:    Patient ID: Carlos Newman, male    DOB: 03/02/2001, 19 y.o.   MRN: [id number]  Carlos Newman is a 19 y.o. male presenting on 11/14/2019 for Annual Exam (pt need a physical for college )   HPI  HEALTH MAINTENANCE:  Weight/BMI: Healthy, BMI 24.84 Physical activity: Stays active Diet: Regular Seatbelt: Yes Sunscreen: Yes HIV & Hep C Screening: Offered and declined GC/CT: Offered and declined Optometry: No issues, goes as needed Dentistry: Has been within the year  IMMUNIZATIONS: Influenza: Due next season Tetanus: Due after 01/27/2020 COVID: Completed Moderna series (Had gotten in [place] at [employer] [place])  Depression screen [facility] 2/9 11/14/2019 05/11/2017 04/28/2015  Decreased Interest 0 0 0  Down, Depressed, Hopeless 0 1 0  PHQ - 2 Score 0 1 0  Altered sleeping - 2 -  Tired, decreased energy - 2 -  Change in appetite - 2 -  Feeling bad or failure about yourself  - 2 -  Trouble concentrating - 1 -  Moving slowly or fidgety/restless - 1 -  Suicidal thoughts - 0 -  PHQ-9 Score - 11 -  Difficult doing work/chores - Somewhat difficult -    Past Medical History:  Diagnosis Date  . Hematuria    Past Surgical History:  Procedure Laterality Date  . FOOT SURGERY     Left   . WISDOM TOOTH EXTRACTION     Social History   Socioeconomic History  . Marital status: Single    Spouse name: Not on file  . Number of children: Not on file  . Years of education: Not on file  . Highest education level: Not on file  Occupational History  . Not on file  Tobacco Use  . Smoking status: Current Some Day Smoker    Packs/day: 0.50    Types: Cigarettes  . Smokeless tobacco: Former Neurosurgeon    Types: Chew  Substance and Sexual Activity  . Alcohol use: Yes    Alcohol/week: 0.0 standard drinks    Comment: social  . Drug use: Yes    Types: Marijuana  . Sexual activity: Not on file  Other Topics Concern  . Not on file  Social History  Narrative  . Not on file   Social Determinants of Health   Financial Resource Strain:   . Difficulty of Paying Living Expenses:   Food Insecurity:   . Worried About Programme researcher, broadcasting/film/video in the Last Year:   . Barista in the Last Year:   Transportation Needs:   . Freight forwarder (Medical):   Marland Kitchen Lack of Transportation (Non-Medical):   Physical Activity:   . Days of Exercise per Week:   . Minutes of Exercise per Session:   Stress:   . Feeling of Stress :   Social Connections:   . Frequency of Communication with Friends and Family:   . Frequency of Social Gatherings with Friends and Family:   . Attends Religious Services:   . Active Member of Clubs or Organizations:   . Attends Banker Meetings:   Marland Kitchen Marital Status:   Intimate Partner Violence:   . Fear of Current or Ex-Partner:   . Emotionally Abused:   Marland Kitchen Physically Abused:   . Sexually Abused:    Family History  Problem Relation Age of Onset  . Hematuria Paternal Aunt   . Healthy Mother   . Healthy Father   . Healthy Sister   .  Healthy Sister    No current outpatient medications on file prior to visit.   No current facility-administered medications on file prior to visit.    Per HPI unless specifically indicated above     Objective:    BP (!) 112/49 (BP Location: Left Arm, Patient Position: Sitting, Cuff Size: Normal)   Pulse 100   Temp (!) 97.3 F (36.3 C) (Temporal)   Ht 5\' 7"  (1.702 m)   Wt 158 lb 9.6 oz (71.9 kg)   BMI 24.84 kg/m   Wt Readings from Last 3 Encounters:  11/14/19 158 lb 9.6 oz (71.9 kg)  10/01/19 160 lb (72.6 kg)  09/09/19 160 lb (72.6 kg)   Physical Exam Vitals reviewed.  Constitutional:      General: He is not in acute distress.    Appearance: Normal appearance. He is well-developed, well-groomed and normal weight. He is not ill-appearing or toxic-appearing.  HENT:     Head: Normocephalic.     Right Ear: Tympanic membrane, ear canal and external ear normal.  There is no impacted cerumen.     Left Ear: Tympanic membrane, ear canal and external ear normal. There is no impacted cerumen.     Nose: Nose normal. No congestion or rhinorrhea.     Mouth/Throat:     Mouth: Mucous membranes are moist.     Pharynx: Oropharynx is clear. No oropharyngeal exudate or posterior oropharyngeal erythema.  Eyes:     General: Lids are normal. Vision grossly intact. No scleral icterus.       Right eye: No discharge or hordeolum.        Left eye: No discharge or hordeolum.     Extraocular Movements: Extraocular movements intact.     Conjunctiva/sclera: Conjunctivae normal.     Pupils: Pupils are equal, round, and reactive to light.  Neck:     Thyroid: No thyroid mass, thyromegaly or thyroid tenderness.  Cardiovascular:     Rate and Rhythm: Normal rate and regular rhythm.     Pulses: Normal pulses.          Dorsalis pedis pulses are 2+ on the right side and 2+ on the left side.     Heart sounds: Normal heart sounds. No murmur. No friction rub. No gallop.   Pulmonary:     Effort: Pulmonary effort is normal. No respiratory distress.     Breath sounds: Normal breath sounds.  Abdominal:     General: Abdomen is flat. Bowel sounds are normal. There is no distension or abdominal bruit.     Palpations: Abdomen is soft. There is no hepatomegaly, splenomegaly or mass.     Tenderness: There is no abdominal tenderness. There is no guarding or rebound.     Hernia: No hernia is present.  Genitourinary:    Comments: deferred Musculoskeletal:        General: Normal range of motion.     Cervical back: Normal range of motion and neck supple. No tenderness.     Right lower leg: No edema.     Left lower leg: No edema.     Comments: 5/5 BUE & BLE strength and normal tone  Feet:     Right foot:     Skin integrity: Skin integrity normal.     Left foot:     Skin integrity: Skin integrity normal.  Lymphadenopathy:     Cervical: No cervical adenopathy.  Skin:    General: Skin  is warm and dry.     Capillary Refill: Capillary  refill takes less than 2 seconds.  Neurological:     General: No focal deficit present.     Mental Status: He is alert and oriented to person, place, and time.     Cranial Nerves: Cranial nerves are intact. No cranial nerve deficit.     Sensory: Sensation is intact. No sensory deficit.     Motor: Motor function is intact. No weakness.     Coordination: Coordination is intact. Coordination normal.     Gait: Gait is intact. Gait normal.     Deep Tendon Reflexes: Reflexes normal.  Psychiatric:        Attention and Perception: Attention and perception normal.        Mood and Affect: Mood and affect normal.        Speech: Speech normal.        Behavior: Behavior normal. Behavior is cooperative.        Thought Content: Thought content normal.        Cognition and Memory: Cognition and memory normal.        Judgment: Judgment normal.     Results for orders placed or performed in visit on 10/01/19  Microscopic Examination   URINE  Result Value Ref Range   WBC, UA 0-5 0 - 5 /hpf   RBC None seen 0 - 2 /hpf   Epithelial Cells (non renal) None seen 0 - 10 /hpf   Bacteria, UA None seen None seen/Few  Urinalysis, Complete  Result Value Ref Range   Specific Gravity, UA 1.015 1.005 - 1.030   pH, UA 6.5 5.0 - 7.5   Color, UA Yellow Yellow   Appearance Ur Clear Clear   Leukocytes,UA Negative Negative   Protein,UA Negative Negative/Trace   Glucose, UA Negative Negative   Ketones, UA Negative Negative   RBC, UA Negative Negative   Bilirubin, UA Negative Negative   Urobilinogen, Ur 0.2 0.2 - 1.0 mg/dL   Nitrite, UA Negative Negative   Microscopic Examination See below:       Assessment & Plan:   Problem List Items Addressed This Visit      Other   Routine medical exam    Annual physical exam without new findings.  Well adult with no acute concerns.  Plan: 1. Obtain health maintenance screenings as above according to age. - Increase  physical activity to 30 minutes most days of the week.  - Eat healthy diet high in vegetables and fruits; low in refined carbohydrates. - Screening labs and tests as ordered 2. Return 1 year for annual physical.        Other Visit Diagnoses    Immunization due    -  Primary      No orders of the defined types were placed in this encounter.     Follow up plan: Return in about 1 year (around 11/13/2020) for CPE.  Harlin Rain, FNP-C Family Nurse Practitioner [facility] 11/14/2019, 3:17 PM

## 2019-11-14 NOTE — Addendum Note (Signed)
Addended by: Lonna Cobb on: 11/14/2019 04:17 PM   Modules accepted: Orders

## 2019-11-14 NOTE — Patient Instructions (Signed)
Well Visit, Ages 59 to 29: Care Instructions Overview  Well visits can help you stay healthy. Your provider has checked your overall health and may have suggested ways to take good care of yourself. Your provider also may have recommended tests. At home, you can help prevent illness with healthy eating, regular exercise, and other steps.  Follow-up care is a key part of your treatment and safety. Be sure to make and go to all appointments, and call your provider if you are having problems. It's also a good idea to know your test results and keep a list of the medicines you take.  How can you care for yourself at home?   Get screening tests that you and your doctor decide on. Screening helps find diseases before any symptoms appear.   Eat healthy foods. Choose fruits, vegetables, whole grains, protein, and low-fat dairy foods. Limit fat, especially saturated fat. Reduce salt in your diet.   Limit alcohol. If you are a man, have no more than 2 drinks a day or 14 drinks a week. If you are a woman, have no more than 1 drink a day or 7 drinks a week.   Get at least 30 minutes of physical activity on most days of the week.  We recommend you go no more than 2 days in a row without exercise. Walking is a good choice. You also may want to do other activities, such as running, swimming, cycling, or playing tennis or team sports. Discuss any changes in your exercise program with your provider.   Reach and stay at a healthy weight. This will lower your risk for many problems, such as obesity, diabetes, heart disease, and high blood pressure.   Do not smoke or allow others to smoke around you. If you need help quitting, talk to your provider about stop-smoking programs and medicines. These can increase your chances of quitting for good.  Can call 1-800-QUIT-NOW 610-270-9846) for the [facility], assistance with smoking cessation.   Care for your mental health. It is easy to get weighed  down by worry and stress. Learn strategies to manage stress, like deep breathing and mindfulness, and stay connected with your family and community. If you find you often feel sad or hopeless, talk with your provider. Treatment can help.   Talk to your provider about whether you have any risk factors for sexually transmitted infections (STIs). You can help prevent STIs if you wait to have sex with a new partner (or partners) until you've each been tested for STIs. It also helps if you use condoms (male or male condoms) and if you limit your sex partners to one person who only has sex with you. Vaccines are available for some STIs, such as HPV (these are age dependent).   Use birth control if it's important to you to prevent pregnancy. Talk with your provider about the choices available and what might be best for you.   If you think you may have a problem with alcohol or drug use, talk to your provider. This includes prescription medicines (such as amphetamines and opioids) and illegal drugs (such as cocaine and methamphetamine). Your provider can help you figure out what type of treatment is best for you.   If you have concerns about domestic violence or intimate partner violence, there are resources available to you. National Domestic Abuse Hotline 661-066-9238   Protect your skin from too much sun. When you're outdoors from 10 a.m. to 4 p.m., stay in the  shade or cover up with clothing and a hat with a wide brim. Wear sunglasses that block UV rays. Even when it's cloudy, put broad-spectrum sunscreen (SPF 30 or higher) on any exposed skin.   See a dentist one or two times a year for checkups and to have your teeth cleaned.   See an eye doctor once per year for an eye exam.   Wear a seat belt in the car.  When should you call for help?  Watch closely for changes in your health, and be sure to contact your provider if you have any problems or symptoms that concern you.  We will plan to  see you back in 1 year for your next wellness visit  You are due for your next TDAP vaccination after 01/27/2020  You will receive a survey after today's visit either digitally by e-mail or paper by USPS mail. Your experiences and feedback matter to [place].  Please respond so we know how we are doing as we provide care for you.  Call us with any questions/concerns/needs.  It is my goal to be available to you for your health concerns.  Thanks for choosing me to be a partner in your healthcare needs!  Charlaine Dalton, FNP-C Family Nurse Practitioner [facility] Phone: [phone]

## 2019-12-02 ENCOUNTER — Telehealth: Payer: Self-pay

## 2019-12-02 DIAGNOSIS — A630 Anogenital (venereal) warts: Secondary | ICD-10-CM | POA: Diagnosis not present

## 2019-12-02 NOTE — Telephone Encounter (Signed)
Copied from CRM [id number]. Topic: General - Inquiry >> Dec 02, 2019 10:15 AM Daphine Deutscher D wrote: Reason for CRM: Pt was just in the see his dermatologist and she diagnosed him with HPV and she suggested that he get updated on his vaccines for HPV.  He stated he thinks he had one when he was 10 but she told him there was a newer vaccine that he needed to get because there are more strands of HPV now.  He wants to know if the office has the new HPV vaccine.  CB#  [phone]

## 2019-12-02 NOTE — Telephone Encounter (Signed)
05/2019 new Gardasil 9 was approved to cover 9 strains.  We have this in clinic.  Unsure what he had gotten at his first dose but we can have him come in for vaccination with the new Gardasil 9

## 2019-12-04 ENCOUNTER — Encounter: Payer: Self-pay | Admitting: Family Medicine

## 2019-12-04 ENCOUNTER — Ambulatory Visit (INDEPENDENT_AMBULATORY_CARE_PROVIDER_SITE_OTHER): Payer: BC Managed Care – PPO | Admitting: Family Medicine

## 2019-12-04 ENCOUNTER — Other Ambulatory Visit (HOSPITAL_COMMUNITY)
Admission: RE | Admit: 2019-12-04 | Discharge: 2019-12-04 | Disposition: A | Discharge status: Home / Self Care | Payer: BC Managed Care – PPO | Source: Ambulatory Visit | Attending: Family Medicine | Admitting: Family Medicine

## 2019-12-04 ENCOUNTER — Other Ambulatory Visit: Payer: Self-pay

## 2019-12-04 VITALS — BP 104/55 | HR 73 | Temp 97.1°F | Ht 67.0 in | Wt 154.2 lb

## 2019-12-04 DIAGNOSIS — Z113 Encounter for screening for infections with a predominantly sexual mode of transmission: Secondary | ICD-10-CM | POA: Insufficient documentation

## 2019-12-04 DIAGNOSIS — Z23 Encounter for immunization: Secondary | ICD-10-CM

## 2019-12-04 DIAGNOSIS — B977 Papillomavirus as the cause of diseases classified elsewhere: Secondary | ICD-10-CM

## 2019-12-04 NOTE — Assessment & Plan Note (Signed)
Denies STI testing in the recent past or before any recent sexual partners.  Offered and accepted labs to screen for sexually transmitted infections.  Plan: 1. Labs to be completed in clinic today. 2. Urine to be sent to the lab for processing.

## 2019-12-04 NOTE — Patient Instructions (Signed)
We have given you your immunization for TDAP and HPV.  This was vaccination #1 with Gardasil 9.  We will see you back in 2 months from today and 6 months from today for your next HPV vaccinations.  We have sent your blood and urine to the lab for processing.  We will contact you when we receive the results.  We will plan to see you back in 2 months and 6 months  You will receive a survey after today's visit either digitally by e-mail or paper by [name]. Your experiences and feedback matter to [place].  Please respond so we know how we are doing as we provide care for you.  Call us with any questions/concerns/needs.  It is my goal to be available to you for your health concerns.  Thanks for choosing me to be a partner in your healthcare needs!  Charlaine Dalton, FNP-C Family Nurse Practitioner [facility] Phone: [phone]

## 2019-12-04 NOTE — Progress Notes (Signed)
Subjective:    Patient ID: RIKI Newman, male    DOB: 11-07-2000, 19 y.o.   MRN: [id number]  Carlos Newman is a 19 y.o. male presenting on 12/04/2019 for Genital Warts (pt would like to get the HPV vaccine )   HPI  [name] presents to clinic for immunization for HPV and TDAP.  Reports he has recently met with dermatology and diagnosed with HPV genital warts.  Has had one HPV immunization when he was younger and requesting to restart immunization series.  Offered and accepted STI screening labs.  Depression screen [facility] 2/9 11/14/2019 05/11/2017 04/28/2015  Decreased Interest 0 0 0  Down, Depressed, Hopeless 0 1 0  PHQ - 2 Score 0 1 0  Altered sleeping - 2 -  Tired, decreased energy - 2 -  Change in appetite - 2 -  Feeling bad or failure about yourself  - 2 -  Trouble concentrating - 1 -  Moving slowly or fidgety/restless - 1 -  Suicidal thoughts - 0 -  PHQ-9 Score - 11 -  Difficult doing work/chores - Somewhat difficult -    Social History   Tobacco Use  . Smoking status: Current Some Day Smoker    Packs/day: 0.25    Types: Cigarettes  . Smokeless tobacco: Former Systems developer    Types: Secondary school teacher  . Vaping Use: Some days  . Substances: Nicotine, THC, Flavoring  Substance Use Topics  . Alcohol use: Yes    Alcohol/week: 0.0 standard drinks    Comment: social  . Drug use: Yes    Types: Marijuana    Review of Systems  Constitutional: Negative.   HENT: Negative.   Eyes: Negative.   Respiratory: Negative.   Cardiovascular: Negative.   Gastrointestinal: Negative.   Endocrine: Negative.   Genitourinary: Negative.        Genital wart  Musculoskeletal: Negative.   Skin: Negative.   Allergic/Immunologic: Negative.   Neurological: Negative.   Hematological: Negative.   Psychiatric/Behavioral: Negative.    Per HPI unless specifically indicated above     Objective:    BP (!) 104/55 (BP Location: Left Arm, Patient Position: Sitting, Cuff Size: Normal)    Pulse 73   Temp (!) 97.1 F (36.2 C) (Temporal)   Ht 5' 7"  (1.702 m)   Wt 154 lb 3.2 oz (69.9 kg)   SpO2 100%   BMI 24.15 kg/m   Wt Readings from Last 3 Encounters:  12/04/19 154 lb 3.2 oz (69.9 kg)  11/14/19 158 lb 9.6 oz (71.9 kg)  10/01/19 160 lb (72.6 kg)    Physical Exam Vitals reviewed.  Constitutional:      General: He is not in acute distress.    Appearance: Normal appearance. He is well-developed, well-groomed and normal weight. He is not ill-appearing or toxic-appearing.  HENT:     Head: Normocephalic and atraumatic.     Nose:     Comments: [name] is in place, covering mouth and nose. Eyes:     General:        Right eye: No discharge.        Left eye: No discharge.     Extraocular Movements: Extraocular movements intact.     Conjunctiva/sclera: Conjunctivae normal.     Pupils: Pupils are equal, round, and reactive to light.  Cardiovascular:     Rate and Rhythm: Normal rate and regular rhythm.     Pulses: Normal pulses.     Heart sounds: Normal heart sounds. No  murmur heard.  No friction rub. No gallop.   Pulmonary:     Effort: Pulmonary effort is normal. No respiratory distress.     Breath sounds: Normal breath sounds.  Genitourinary:    Pubic Area: No rash or pubic lice.      Penis: Normal and circumcised.      Testes: Normal.     Comments: Slight ecchymosis posterior side of penis, base of glans, no s/s of infection. Musculoskeletal:     Right lower leg: No edema.     Left lower leg: No edema.  Skin:    General: Skin is warm and dry.     Capillary Refill: Capillary refill takes less than 2 seconds.  Neurological:     General: No focal deficit present.     Mental Status: He is alert and oriented to person, place, and time.  Psychiatric:        Attention and Perception: Attention and perception normal.        Mood and Affect: Mood and affect normal.        Speech: Speech normal.        Behavior: Behavior normal. Behavior is cooperative.         Thought Content: Thought content normal.        Cognition and Memory: Cognition and memory normal.        Judgment: Judgment normal.    Results for orders placed or performed in visit on 11/14/19  POCT Urinalysis Dipstick  Result Value Ref Range   Color, UA yellow    Clarity, UA clear    Glucose, UA Negative Negative   Bilirubin, UA negative    Ketones, UA negative    Spec Grav, UA <=1.005 (A) 1.010 - 1.025   Blood, UA negative    pH, UA 5.0 5.0 - 8.0   Protein, UA Negative Negative   Urobilinogen, UA 0.2 0.2 or 1.0 E.U./dL   Nitrite, UA negative    Leukocytes, UA Negative Negative   Appearance     Odor        Assessment & Plan:   Problem List Items Addressed This Visit      Other   Immunization due    Due for immunization TDAP and HPV.  Reviewed risks/benefits and provided with VIS.  Plan: 1. TDAP given today 2. HPV vaccine #1 of Gardasil 9 given today      HPV in male - Primary    Reports has recently met with dermatology and diagnosed with genital warts.  Was told to return for full HPV vaccination.  Plan: 1. Continue follow up visit with dermatology 2. See immunization due AP      Relevant Orders   HPV 9-valent vaccine,Recombinat (Completed)   Screening examination for sexually transmitted disease    Denies STI testing in the recent past or before any recent sexual partners.  Offered and accepted labs to screen for sexually transmitted infections.  Plan: 1. Labs to be completed in clinic today. 2. Urine to be sent to the lab for processing.       Other Visit Diagnoses    Need for diphtheria-tetanus-pertussis (Tdap) vaccine       Relevant Orders   Tdap vaccine greater than or equal to 7yo IM (Completed)   Routine screening for STI (sexually transmitted infection)       Relevant Orders   HIV antibody (with reflex)   Hepatitis C Antibody   RPR   Urine cytology ancillary only  No orders of the defined types were placed in this  encounter.     Follow up plan: Return in about 6 months (around 06/04/2020) for Back in 2 months & 6 months for next HPV Vaccines (nurse visits).   Harlin Rain, [place] Family Nurse Practitioner [facility] 12/04/2019, 10:25 AM

## 2019-12-04 NOTE — Assessment & Plan Note (Signed)
Reports has recently met with dermatology and diagnosed with genital warts.  Was told to return for full HPV vaccination.  Plan: 1. Continue follow up visit with dermatology 2. See immunization due AP

## 2019-12-04 NOTE — Assessment & Plan Note (Signed)
Due for immunization TDAP and HPV.  Reviewed risks/benefits and provided with VIS.  Plan: 1. TDAP given today 2. HPV vaccine #1 of Gardasil 9 given today

## 2019-12-05 LAB — HEPATITIS C ANTIBODY
Hepatitis C Ab: NONREACTIVE
SIGNAL TO CUT-OFF: 0.25 (ref <1.00)

## 2019-12-05 LAB — HIV ANTIBODY (ROUTINE TESTING W REFLEX): HIV 1&2 Ab, 4th Generation: NONREACTIVE

## 2019-12-05 LAB — RPR: RPR Ser Ql: NONREACTIVE

## 2019-12-08 LAB — URINE CYTOLOGY ANCILLARY ONLY
Chlamydia: NEGATIVE
Comment: NEGATIVE
Comment: NEGATIVE
Comment: NORMAL
Neisseria Gonorrhea: NEGATIVE
Trichomonas: NEGATIVE

## 2019-12-29 ENCOUNTER — Telehealth: Payer: Self-pay | Admitting: Radiology

## 2019-12-29 NOTE — Telephone Encounter (Signed)
Patient LMOM to cancel follow up for hematuria. LMOM for patient to call to reschedule as it is important to verify that hematuria has resolved.

## 2019-12-30 DIAGNOSIS — A630 Anogenital (venereal) warts: Secondary | ICD-10-CM | POA: Diagnosis not present

## 2020-01-01 ENCOUNTER — Ambulatory Visit: Payer: BC Managed Care – PPO | Admitting: Urology

## 2020-01-14 ENCOUNTER — Ambulatory Visit (INDEPENDENT_AMBULATORY_CARE_PROVIDER_SITE_OTHER): Payer: BC Managed Care – PPO | Admitting: Family Medicine

## 2020-01-14 ENCOUNTER — Encounter: Payer: Self-pay | Admitting: Family Medicine

## 2020-01-14 ENCOUNTER — Other Ambulatory Visit: Payer: Self-pay

## 2020-01-14 VITALS — BP 116/61 | HR 78 | Temp 98.0°F | Resp 17 | Ht 67.0 in | Wt 163.8 lb

## 2020-01-14 DIAGNOSIS — T7840XA Allergy, unspecified, initial encounter: Secondary | ICD-10-CM | POA: Insufficient documentation

## 2020-01-14 DIAGNOSIS — L509 Urticaria, unspecified: Secondary | ICD-10-CM | POA: Insufficient documentation

## 2020-01-14 MED ORDER — METHYLPREDNISOLONE SODIUM SUCC 125 MG IJ SOLR
125.0000 mg | Freq: Once | INTRAMUSCULAR | Status: AC
  Administered 2020-01-14: 125 mg via INTRAMUSCULAR

## 2020-01-14 MED ORDER — PREDNISONE 20 MG PO TABS: 40.0000 mg | ORAL_TABLET | Freq: Every day | ORAL | 0 refills | Status: AC

## 2020-01-14 NOTE — Progress Notes (Signed)
Subjective:    Patient ID: Carlos Newman, male    DOB: 05-01-2001, 19 y.o.   MRN: [id number]  Carlos Newman is a 19 y.o. male presenting on 01/14/2020 for Insect Bite (swollen Rt wrist, Rt forearm and Rt hand due to insect bite. Pt complains of discomfort and burning sensation x 19hrs ago . He took Ibuprofen Benadryl for the discomfort. 600MG  of Ibuprofen about 9:am this morning. Benadryl 25Mg  on yesterday )   HPI  Patient presents to clinic with concerns of swollen right hand, wrist and forearm x 1 day.  Was working in a shed yesterday and had gotten stung by a wasp in his right distal forearm on the radial side.  Has had an increase in swelling, feels the arm is heavier than his left arm, swelling from hand through elbow.  Has full ROM of fingers, hand, wrist and elbow.  Has taken ibuprofen and benadryl with minimal relief of symptoms.  Depression screen [facility] 2/9 11/14/2019 05/11/2017 04/28/2015  Decreased Interest 0 0 0  Down, Depressed, Hopeless 0 1 0  PHQ - 2 Score 0 1 0  Altered sleeping - 2 -  Tired, decreased energy - 2 -  Change in appetite - 2 -  Feeling bad or failure about yourself  - 2 -  Trouble concentrating - 1 -  Moving slowly or fidgety/restless - 1 -  Suicidal thoughts - 0 -  PHQ-9 Score - 11 -  Difficult doing work/chores - Somewhat difficult -    Social History   Tobacco Use  . Smoking status: Current Some Day Smoker    Packs/day: 0.10    Types: Cigarettes  . Smokeless tobacco: Former 05/13/2017    Types: 04/30/2015  . Vaping Use: Some days  . Substances: Nicotine, THC, Flavoring  Substance Use Topics  . Alcohol use: Yes    Alcohol/week: 0.0 standard drinks    Comment: social  . Drug use: Yes    Types: Marijuana    Review of Systems  Constitutional: Negative.   HENT: Negative.   Eyes: Negative.   Respiratory: Negative.   Cardiovascular: Negative.   Gastrointestinal: Negative.   Endocrine: Negative.   Genitourinary: Negative.     Musculoskeletal: Positive for myalgias. Negative for arthralgias, back pain, gait problem, joint swelling, neck pain and neck stiffness.  Skin: Negative.        Wasp sting to right distal forearm radial aspect  Allergic/Immunologic: Negative.   Neurological: Negative.   Hematological: Negative.   Psychiatric/Behavioral: Negative.    Per HPI unless specifically indicated above     Objective:    BP 116/61 (BP Location: Left Arm, Patient Position: Sitting, Cuff Size: Normal)   Pulse 78   Temp 98 F (36.7 C) (Oral)   Resp 17   Ht 5\' 7"  (1.702 m)   Wt 163 lb 12.8 oz (74.3 kg)   SpO2 100%   BMI 25.65 kg/m   Wt Readings from Last 3 Encounters:  01/14/20 163 lb 12.8 oz (74.3 kg)  12/04/19 154 lb 3.2 oz (69.9 kg)  11/14/19 158 lb 9.6 oz (71.9 kg)    Physical Exam Vitals reviewed.  Constitutional:      General: He is not in acute distress.    Appearance: Normal appearance. He is well-developed, well-groomed and overweight. He is not ill-appearing or toxic-appearing.  HENT:     Head: Normocephalic and atraumatic.     Nose:     Comments: 03/15/20 is in place, covering  mouth and nose. Eyes:     General:        Right eye: No discharge.        Left eye: No discharge.     Extraocular Movements: Extraocular movements intact.     Conjunctiva/sclera: Conjunctivae normal.     Pupils: Pupils are equal, round, and reactive to light.  Cardiovascular:     Pulses: Normal pulses.  Pulmonary:     Effort: Pulmonary effort is normal. No respiratory distress.  Musculoskeletal:     Right forearm: Swelling present. No edema, deformity, lacerations, tenderness or bony tenderness.     Left forearm: Normal.     Right wrist: Swelling present. No deformity, effusion, lacerations or tenderness. Normal range of motion. Normal pulse.     Left wrist: Normal.     Right hand: Swelling present. No deformity, lacerations or tenderness. Normal range of motion. Normal strength. Normal sensation. Normal  capillary refill. Normal pulse.     Left hand: Normal.     Right lower leg: No edema.     Left lower leg: No edema.  Skin:    General: Skin is warm and dry.     Capillary Refill: Capillary refill takes less than 2 seconds.     Comments: Wasp sting right distal forearm radial side, no s/s of infection  Neurological:     General: No focal deficit present.     Mental Status: He is alert and oriented to person, place, and time.  Psychiatric:        Attention and Perception: Attention and perception normal.        Mood and Affect: Mood and affect normal.        Speech: Speech normal.        Behavior: Behavior normal. Behavior is cooperative.        Thought Content: Thought content normal.        Cognition and Memory: Cognition and memory normal.    Results for orders placed or performed in visit on 12/04/19  HIV antibody (with reflex)  Result Value Ref Range   HIV 1&2 Ab, 4th Generation NON-REACTIVE NON-REACTI  Hepatitis C Antibody  Result Value Ref Range   Hepatitis C Ab NON-REACTIVE NON-REACTI   SIGNAL TO CUT-OFF 0.25 <1.00  RPR  Result Value Ref Range   RPR Ser Ql NON-REACTIVE NON-REACTI  Urine cytology ancillary only  Result Value Ref Range   Trichomonas Negative    Chlamydia Negative    Neisseria Gonorrhea Negative    Comment Normal Reference Range Trichomonas - Negative    Comment Normal Reference Ranger Chlamydia - Negative    Comment      Normal Reference Range Neisseria Gonorrhea - Negative      Assessment & Plan:   Problem List Items Addressed This Visit      Musculoskeletal and Integument   Urticaria - Primary    Urticaria s/p allergic reaction to wasp sting yesterday.  Wasp sting to radial side of distal forearm.  Swelling in right hand through right elbow.  No numbness/tingling/weakness, full ROM, no s/s of infection.  Discussed breaking the allergy cycle, given SoluMedrol 125mg  IM today in clinic and will place on Prednisone 40mg  x 5 days.  Can take benadryl  25-50mg  every 4-6 hours as needed.  Advised to avoid ibuprofen, NSAIDs, advil, motrin, aleve, goody's BC powder until completed prednisone.  RTC PRN  Plan: 1. Solumedrol 125mg  IM given in clinic 2. Begin prednisone 40mg  daily (beginning tomorrow) x 5 days 3.  Practice wrist and elbow ROM exercises to help fluid move 4. Can use compression sleeve on wrist/arm to help decrease fluid swelling 5. RTC PRN        Other   Allergic reaction    See urticaria A/P      Relevant Medications   predniSONE (DELTASONE) 20 MG tablet      Meds ordered this encounter  Medications  . methylPREDNISolone sodium succinate (SOLU-MEDROL) 125 mg/2 mL injection 125 mg  . predniSONE (DELTASONE) 20 MG tablet    Sig: Take 2 tablets (40 mg total) by mouth daily with breakfast for 5 days.    Dispense:  10 tablet    Refill:  0      Follow up plan: Return if symptoms worsen or fail to improve.   Charlaine Dalton, FNP Family Nurse Practitioner [facility] 01/14/2020, 11:51 AM

## 2020-01-14 NOTE — Assessment & Plan Note (Signed)
Urticaria s/p allergic reaction to wasp sting yesterday.  Wasp sting to radial side of distal forearm.  Swelling in right hand through right elbow.  No numbness/tingling/weakness, full ROM, no s/s of infection.  Discussed breaking the allergy cycle, given SoluMedrol 125mg  IM today in clinic and will place on Prednisone 40mg  x 5 days.  Can take benadryl 25-50mg  every 4-6 hours as needed.  Advised to avoid ibuprofen, NSAIDs, advil, motrin, aleve, goody's BC powder until completed prednisone.  RTC PRN  Plan: 1. Solumedrol 125mg  IM given in clinic 2. Begin prednisone 40mg  daily (beginning tomorrow) x 5 days 3. Practice wrist and elbow ROM exercises to help fluid move 4. Can use compression sleeve on wrist/arm to help decrease fluid swelling 5. RTC PRN

## 2020-01-14 NOTE — Patient Instructions (Signed)
As we discussed, you have an allergic reaction to the wasp sting you received yesterday.  We have given you a steroid injection today in clinic.  I have sent in a prescription for prednisone to take 40mg  daily x 5 days.  Be sure to finish the full prescription.  Can take Benadryl 25mg  (1 tablet) every 4-6 hours as needed for swelling/discomfort.  Can take Benadryl 50mg  (2 tablets) before bed to help with swelling.  This may cause sedation so be careful to not drive or operate heavy machinery while taking this medication.  Be sure to work on your range of motion of your right wrist and right elbow to help with the fluid build up to be reduced  We will plan to see you back if symptoms worsen or fail to improve  You will receive a survey after today's visit either digitally by e-mail or paper by . Your experiences and feedback matter to .  Please respond so we know how we are doing as we provide care for you.  Call with any questions/concerns/needs.  It is my goal to be available to you for your health concerns.  Thanks for choosing me to be a partner in your healthcare needs!  [name], FNP-C Family Nurse Practitioner [facility] Phone: [phone]

## 2020-01-14 NOTE — Assessment & Plan Note (Signed)
See urticaria A/P

## 2020-02-03 ENCOUNTER — Other Ambulatory Visit: Payer: Self-pay

## 2020-02-03 ENCOUNTER — Ambulatory Visit (INDEPENDENT_AMBULATORY_CARE_PROVIDER_SITE_OTHER): Payer: BC Managed Care – PPO

## 2020-02-03 DIAGNOSIS — Z23 Encounter for immunization: Secondary | ICD-10-CM

## 2020-02-25 ENCOUNTER — Ambulatory Visit: Payer: BC Managed Care – PPO | Admitting: Family Medicine

## 2020-02-25 ENCOUNTER — Telehealth: Payer: Self-pay

## 2020-02-25 NOTE — Telephone Encounter (Signed)
Attempted to contact the patient, no answer. His mailbox full.

## 2020-03-05 ENCOUNTER — Ambulatory Visit: Payer: BC Managed Care – PPO

## 2020-03-05 ENCOUNTER — Other Ambulatory Visit: Payer: Self-pay

## 2020-06-07 ENCOUNTER — Ambulatory Visit: Payer: BC Managed Care – PPO

## 2020-06-16 ENCOUNTER — Other Ambulatory Visit: Payer: Self-pay

## 2020-06-16 ENCOUNTER — Ambulatory Visit: Payer: BC Managed Care – PPO | Admitting: Family Medicine

## 2020-06-16 ENCOUNTER — Ambulatory Visit (INDEPENDENT_AMBULATORY_CARE_PROVIDER_SITE_OTHER): Payer: BC Managed Care – PPO

## 2020-06-16 DIAGNOSIS — Z23 Encounter for immunization: Secondary | ICD-10-CM | POA: Diagnosis not present

## 2020-07-19 ENCOUNTER — Ambulatory Visit: Payer: Self-pay | Admitting: Family Medicine

## 2020-07-19 NOTE — Telephone Encounter (Signed)
Pt reports "Popped pimple on left knee over 2 weeks ago." States started swelling, did Tele Visit "With online doctor." States placed on antibiotics for 10 days, does not recall name, completed 4 days ago. States remains swollen and "Discolored." States no worsening "Just not any better." Reports did another on-line visit. "Doctor said give it 3-4 days because the antibiotics are still working but I don't know if he knows what he's talking about."  Agent placed request to practice regarding possibility of  "Fitting pt in to be seen"  prior to triage. Advised UC if does not hear back, any worsening symptoms.  States will follow disposition.  Reason for Disposition . [1] Taking antibiotic > 72 hours (3 days) AND [2] infected wound not improved (i.e., pain, pus, redness)  Answer Assessment - Initial Assessment Questions 1. LOCATION: "Where is the wound located?"      Left knee 2. WOUND APPEARANCE: "What does the wound look like?"      Swollen "Right below knee cap 3. SIZE: If redness is present, ask: "What is the size of the red area?" (Inches, centimeters, or compare to size of a coin)      "Discolored" 4. SPREAD: "What's changed in the last day?"  "Do you see any red streaks coming from the wound?"     No 5. ONSET: "When did it start to look infected?"      ocured 2 weeks ago "Popped pimple." 6. MECHANISM: "How did the wound start, what was the cause?"     Picked pimple 7. PAIN: "Is there any pain?" If Yes, ask: "How bad is the pain?"   (Scale 1-10; or mild, moderate, severe)     mild 8. FEVER: "Do you have a fever?" If Yes, ask: "What is your temperature, how was it measured, and when did it start?"     no 9. OTHER SYMPTOMS: "Do you have any other symptoms?" (e.g., shaking chills, weakness, rash elsewhere on body)     no  Protocols used: WOUND INFECTION-A-AH

## 2020-09-30 ENCOUNTER — Ambulatory Visit: Payer: Self-pay | Admitting: *Deleted

## 2020-09-30 NOTE — Telephone Encounter (Signed)
The pt called with multiplee complaints; the pt says he had stomach pain a couple of years that progreseed to testicular pain a year ago; pain constant, dull and sore; R testicular vein has lump x 1 month; intermittent bilateral testicle swelling; he was told that he has left testicle retraction; the pt says he had difficulty sleeping due to leg pain and soreness;e says last night his legs were sore and weak; pain located from knees to calf leg pain rated 3-4 out of 10; pain described as dull ache; the pt says his leg symptoms have resolved;   the pt would like to make an appt to be seen because no one has been able to determine the cause of his symptoms; recommendations made per nurse triage protocol; decision tree completed; pt offered and accepted appt with Dr Althea Charon, [facility], 10/01/20 at 1420; he verbalized understanding; will route to office for notification of encounter. Reason for Disposition . [1] Pain comes and goes (intermittent) AND [2] present > 24 hours . Numbness in a leg or foot (i.e., loss of sensation)  Answer Assessment - Initial Assessment Questions 1. SYMPTOM: "What is the main symptom you are concerned about?" (e.g., weakness, numbness)     Bilateral leg weaknes 2. ONSET: "When did this start?" (minutes, hours, days; while sleeping)   09/30/20 at 0100 3. LAST NORMAL: "When was the last time you were normal (no symptoms)?"    Pain and weakness resolved 4. PATTERN "Does this come and go, or has it been constant since it started?"  "Is it present now?"    Now resolved 5. CARDIAC SYMPTOMS: "Have you had any of the following symptoms: chest pain, difficulty breathing, palpitations?"     *no 6. NEUROLOGIC SYMPTOMS: "Have you had any of the following symptoms: headache, dizziness, vision loss, double vision, changes in speech, unsteady on your feet?"     Headache now resolved 7. OTHER SYMPTOMS: "Do you have any other symptoms?"     see 8. PREGNANCY: "Is there any chance you are  pregnant?" "When was your last menstrual period?"     n/a  Answer Assessment - Initial Assessment Questions 1. LOCATION and RADIATION: "Where is the pain located?"      Left testilce 2. QUALITY: "What does the pain feel like?"  (e.g., sharp, dull, aching, burning)     Dull,intermittent 3. SEVERITY: "How bad is the pain?"  (Scale 1-10; or mild, moderate, severe)   - MILD (1-3): doesn't interfere with normal activities    - MODERATE (4-7): interferes with normal activities (e.g., work or school) or awakens from sleep   - SEVERE (8-10): excruciating pain, unable to do any normal activities, difficulty walking    mild 4. ONSET: "When did the pain start?"    1 year ago 5. PATTERN: "Does it come and go, or has it been constant since it started?"      constant 6. SCROTAL APPEARANCE: "What does the scrotum look like?" "Is there any swelling or redness?"    Bilateral testicle swelling x 1 year 7. HERNIA: "Has a doctor ever told you that you have a hernia?"    no 8. OTHER SYMPTOMS: "Do you have any other symptoms?" (e.g., fever, abdominal pain, vomiting, difficulty passing urine)  no  Answer Assessment - Initial Assessment Questions 1. ONSET: "When did the pain start?"      09/29/20 0100 2. LOCATION: "Where is the pain located?"  Bilateral knee to calf 3. PAIN: "How bad is the pain?"    (  Scale 1-10; or mild, moderate, severe)   -  MILD (1-3): doesn't interfere with normal activities    -  MODERATE (4-7): interferes with normal activities (e.g., work or school) or awakens from sleep, limping    -  SEVERE (8-10): excruciating pain, unable to do any normal activities, unable to walk     3-4 out of 10 4. WORK OR EXERCISE: "Has there been any recent work or exercise that involved this part of the body?"     no 5. CAUSE: "What do you think is causing the leg pain?"     Not sure 6. OTHER SYMPTOMS: "Do you have any other symptoms?" (e.g., chest pain, back pain, breathing difficulty, swelling,  rash, fever, numbness, weakness)   no 7. PREGNANCY: "Is there any chance you are pregnant?" "When was your last menstrual period?"     n/a  Protocols used: SCROTAL PAIN-A-AH, LEG PAIN-A-AH, NEUROLOGIC DEFICIT-A-AH

## 2020-10-01 ENCOUNTER — Ambulatory Visit (INDEPENDENT_AMBULATORY_CARE_PROVIDER_SITE_OTHER): Payer: BC Managed Care – PPO | Admitting: Family Medicine

## 2020-10-01 ENCOUNTER — Other Ambulatory Visit: Payer: Self-pay

## 2020-10-01 ENCOUNTER — Encounter: Payer: Self-pay | Admitting: Family Medicine

## 2020-10-01 VITALS — BP 141/78 | HR 100 | Ht 69.0 in | Wt 154.6 lb

## 2020-10-01 DIAGNOSIS — M62830 Muscle spasm of back: Secondary | ICD-10-CM | POA: Diagnosis not present

## 2020-10-01 DIAGNOSIS — N50819 Testicular pain, unspecified: Secondary | ICD-10-CM

## 2020-10-01 DIAGNOSIS — M2550 Pain in unspecified joint: Secondary | ICD-10-CM

## 2020-10-01 DIAGNOSIS — G8929 Other chronic pain: Secondary | ICD-10-CM

## 2020-10-01 MED ORDER — BACLOFEN 10 MG PO TABS: 5.0000 mg | ORAL_TABLET | Freq: Three times a day (TID) | ORAL | 1 refills | Status: DC | PRN

## 2020-10-01 NOTE — Telephone Encounter (Signed)
Please see mychart message.

## 2020-10-01 NOTE — Progress Notes (Signed)
Subjective:    Patient ID: Carlos Newman, male    DOB: [birth year], 20 y.o.   MRN: [id number]  Carlos Newman is a 20 y.o. male presenting on 10/01/2020 for Fatigue (Both legs), Leg Pain, and Testicle Pain  Previous PCP Carlos Skeeters, FNP  Here for acute visit today for chronic issue.  HPI   Chronic Abdominal Pain (Epigastric, RLQ) / Left Testicular discomfort vs pain History of gross hematuria  Prior chronic problem, we discussed in 2020 similar issue. He has since seen other providers for this and has seen GI specialist Dr Allen Norris and also has seen Urology multiple times. Most recently evaluated by [facility] since 07/2019 through 09/2019.   Prior records since 11/2018 show - Scrotal ultrasound 11/22/2018 was normal.  RUS 11/22/2018 was normal  Chronic problems, constellation of symptoms, reviewed background history, 5+ years ago first symptom, urinated blood and continued to still have this issue, still had that problem recurrent. Had Renal US in 2016 looked for kidney stone. - Next he developed abdominal discomfort epigastric had digestive symptoms for a while, has been given variety of diagnosis in past including IBS constipation, also thought to have hernia. He has had imaging with Abdominal US at [facility] 2018, unremarkable - he has changed diet and improved healthy diet - He has see [facility] in 2021, and he had an Abdominal CT Scan unremarkable. See results below.  He has chronic testicular symptoms and abdominal problem as well as described over the past 2 years.  Not taking any OTC analgesia currently NSAID or Tylenol has tried stomach acid medications  Right testicle a few weeks ago on the side with vein, he identified a soft raised lump. Seems to come and go, with some localized swelling involved in the whole testicle.  Left testicle, seems to be more retractile testicle.  He has pain in both testicles.  He does say he sleeps on his side with his legs crossed and it  does put direct pressure on his testicles.  He does sleep on his back now trial past 1 month to see if that does improve. He is unsure if this is helping.  Additional problems today Lump on back with muscle spasm. He does heavy lifting at work He has had cramping episode in legs, wake him up at night - Woke up around 1am, difficulty sleeping due to bilateral leg pain.  Severe bee sting reactions, with swelling. Says never had this reaction before.  He had an infected pimple on his Left knee that turned into cellulitis. Treated with cellulitis. He had leg swelling. Several months later.  He has some cracks on tip of his tongue. He went to dentist, and they thought it was thrush. Was given medicine. Seems to have returned since March 2022. 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  His family member who is a retired Tax adviser provided some insight to him and asks about autoimmune or other conditions and imaging and lab tests that could be done.  History of HPV 1 year ago, has cleared, genital warts.   Depression screen [facility] 2/9 11/14/2019 05/11/2017 04/28/2015  Decreased Interest 0 0 0  Down, Depressed, Hopeless 0 1 0  PHQ - 2 Score 0 1 0  Altered sleeping - 2 -  Tired, decreased energy - 2 -  Change in appetite - 2 -  Feeling bad or failure about yourself  - 2 -  Trouble concentrating - 1 -  Moving slowly or fidgety/restless - 1 -  Suicidal thoughts -  0 -  PHQ-9 Score - 11 -  Difficult doing work/chores - Somewhat difficult -    Social History   Tobacco Use  . Smoking status: Current Some Day Smoker    Packs/day: 0.10    Types: Cigarettes  . Smokeless tobacco: Former Systems developer    Types: Secondary school teacher  . Vaping Use: Some days  . Substances: Nicotine, THC, Flavoring  Substance Use Topics  . Alcohol use: Yes    Alcohol/week: 0.0 standard drinks    Comment: social  . Drug use: Yes    Types: Marijuana    Review of Systems Per HPI unless specifically indicated above     Objective:     BP (!) 141/78   Pulse 100   Ht 5' 9"  (1.753 m)   Wt 154 lb 9.6 oz (70.1 kg)   SpO2 100%   BMI 22.83 kg/m   Wt Readings from Last 3 Encounters:  10/01/20 154 lb 9.6 oz (70.1 kg)  01/14/20 163 lb 12.8 oz (74.3 kg)  12/04/19 154 lb 3.2 oz (69.9 kg)    Physical Exam Vitals and nursing note reviewed.  Constitutional:      General: He is not in acute distress.    Appearance: He is well-developed. He is not diaphoretic.     Comments: Well-appearing, comfortable, cooperative  HENT:     Head: Normocephalic and atraumatic.  Eyes:     General:        Right eye: No discharge.        Left eye: No discharge.     Conjunctiva/sclera: Conjunctivae normal.  Cardiovascular:     Rate and Rhythm: Normal rate.  Pulmonary:     Effort: Pulmonary effort is normal.  Musculoskeletal:     Comments: Left mid to lower back paraspinal muscles with possible knot or nodular density deep within muscle left side, appears can be spasm or muscle knot. Not quite consistent with subcutaneous lipoma, he has had lipoma however removed in past.  Skin:    General: Skin is warm and dry.     Findings: No erythema or rash.  Neurological:     Mental Status: He is alert and oriented to person, place, and time.  Psychiatric:        Behavior: Behavior normal.     Comments: Well groomed, good eye contact, normal speech and thoughts      Prior records since 11/2018 show - Scrotal ultrasound 11/22/2018 was normal.  RUS 11/22/2018 was normal  I have personally reviewed the radiology report from 07/01/19 Abd CT Pelvis  CLINICAL DATA:  20 year old male with history of left testicular pain worsening since last summer. Upper mid right abdominal pain for the past 2 years. Intermittent gross hematuria. Suspected diverticulitis.  EXAM: CT ABDOMEN AND PELVIS WITH CONTRAST  TECHNIQUE: Multidetector CT imaging of the abdomen and pelvis was performed using the standard protocol following bolus administration  of intravenous contrast.  CONTRAST:  126m OMNIPAQUE IOHEXOL 300 MG/ML  SOLN  COMPARISON:  No priors.  FINDINGS: Lower chest: Unremarkable.  Hepatobiliary: No suspicious cystic or solid hepatic lesions. No intra or extrahepatic biliary ductal dilatation. Gallbladder is normal in appearance.  Pancreas: No pancreatic mass. No pancreatic ductal dilatation. No pancreatic or peripancreatic fluid collections or inflammatory changes.  Spleen: Unremarkable.  Adrenals/Urinary Tract: Bilateral kidneys and adrenal glands are normal in appearance. No hydroureteronephrosis. Urinary bladder is normal in appearance.  Stomach/Bowel: Normal appearance of the stomach. No pathologic dilatation of small bowel or colon. Normal  appendix.  Vascular/Lymphatic: No significant atherosclerotic disease, aneurysm or dissection noted in the abdominal or pelvic vasculature. No lymphadenopathy noted in the abdomen or pelvis.  Reproductive: Prostate gland and seminal vesicles are unremarkable in appearance.  Other: No significant volume of ascites.  No pneumoperitoneum.  Musculoskeletal: There are no aggressive appearing lytic or blastic lesions noted in the visualized portions of the skeleton.  IMPRESSION: 1. No acute findings are noted in the abdomen or pelvis to account for the patient's symptoms.   Electronically Signed   By: Vinnie Langton M.D.   On: 07/01/2019 10:15   Results for orders placed or performed in visit on 12/04/19  HIV antibody (with reflex)  Result Value Ref Range   HIV 1&2 Ab, 4th Generation NON-REACTIVE NON-REACTI  Hepatitis C Antibody  Result Value Ref Range   Hepatitis C Ab NON-REACTIVE NON-REACTI   SIGNAL TO CUT-OFF 0.25 <1.00  RPR  Result Value Ref Range   RPR Ser Ql NON-REACTIVE NON-REACTI  Urine cytology ancillary only  Result Value Ref Range   Trichomonas Negative    Chlamydia Negative    Neisseria Gonorrhea Negative    Comment Normal  Reference Range Trichomonas - Negative    Comment Normal Reference Ranger Chlamydia - Negative    Comment      Normal Reference Range Neisseria Gonorrhea - Negative      Assessment & Plan:   Problem List Items Addressed This Visit   None   Visit Diagnoses    Persistent testicular pain    -  Primary   Spasm of muscle of lower back       Relevant Medications   baclofen (LIORESAL) 10 MG tablet      #Back spasm / Nodule Clinically most likely with back spasm / muscle knot, vs trigger point Less likely Lipoma based on exam, location, but cannot rule out Trial on Baclofen 5-63m TID PRN caution sedation May use topical voltaren muscle rub for back as well OTC meds as needed Limit heavy lifting if able  Muscle cramps can take OTC remedy. Improve hydration, electrolytes Leg cramps - Try spoonful of yellow mustard to relieve leg cramps or try daily to prevent the problem - OTC natural option is Hyland's Leg Cramps (Dissolving tablet) take as needed for muscle cramps   #Chronic Testicular pain / pelvic pain in male Nodule on R scrotum, swelling  Long complex course of same constellation of chronic pelvic / testicular pain and variety of symptoms for past >5 years, now more recently documented in past 2+ years since 2020, has seen multiple specialists and providers for this. Work up thus far unremarkable. Has not had conclusive cause of history of hematuria or scrotal abnormality / testicular pain.  [facility] has had CT / Cystoscopy, urine testing. Inconclusive, was referred to Nephrology for hematuria but did not pursue this. They considered Pelvic PT as well. Also not completed.  Now patient has questions on what to do next.  We discussed that I can try to help guide him with further options and will reach out to his previous Urologist to help provide advice on if he should return to them for further evaluation or if we need to seek 2nd opinion.  I will forward  chart and check with them regarding the following: - New R scrotal nodule, not on testicle, not always present, with some swelling, he is inquiring about another repeat scrotal ultrasound. However he has had prior similar imaging in past 2  years - Recommendation / referral on Pelvic Floor PT for chronic pain. - Question if possible Prostatitis, as this was mentioned with prior cystoscopy. Would antibiotics be beneficial or other anti inflammatory regimen?  - Additionally we can consider other work up as requested by patient/familiy with lab work including inflammatory markers ESR CRP ANA TSH Lyme and other testing in future.   Meds ordered this encounter  Medications  . baclofen (LIORESAL) 10 MG tablet    Sig: Take 0.5-1 tablets (5-10 mg total) by mouth 3 (three) times daily as needed for muscle spasms.    Dispense:  30 each    Refill:  1      Follow up plan: Return if symptoms worsen or fail to improve.   Nobie Putnam, [facility] 10/01/2020, 2:44 PM

## 2020-10-01 NOTE — Patient Instructions (Addendum)
Thank you for coming to the office today.  I will reach out to [facility] from Urology to follow-up on the questions we have and help figure out if she can do more for you or if we need a 2nd opinion at a [facility] or other location.  Questions are:  - R Testicular lump and swelling, Repeat Ultrasound again? 3rd in 2 years - Refer to Pelvic floor PT - Dx with inflamed prostate, do we consider treating chronic prostatitis   Consider Chiropractor or in future Physical Therapy for muscle and trigger point.  Less likely Lipoma  Start taking Baclofen (Lioresal) 10mg  (muscle relaxant) - start with half (cut) to one whole pill at night as needed for next 1-3 nights (may make you drowsy, caution with driving) see how it affects you, then if tolerated increase to one pill 2 to 3 times a day or (every 8 hours as needed)  Topical Voltaren muscle rub anti inflammatory can try this on back as well.   Please schedule a Follow-up Appointment to: Return if symptoms worsen or fail to improve.  If you have any other questions or concerns, please feel free to call the office or send a message through MyChart. You may also schedule an earlier appointment if necessary.  Additionally, you may be receiving a survey about your experience at our office within a few days to 1 week by e-mail or mail. We value your feedback.  , DO [facility], [facility]               Low Back Pain Exercises  See other page with pictures of each exercise.  Start with 1 or 2 of these exercises that you are most comfortable with. Do not do any exercises that cause you significant worsening pain. Some of these may cause some "stretching soreness" but it should go away after you stop the exercise, and get better over time. Gradually increase up to 3-4 exercises as tolerated.  Standing hamstring stretch: Place the heel of your leg on a stool about 15 inches high. Keep your knee straight. Lean  forward, bending at the hips until you feel a mild stretch in the back of your thigh. Make sure you do not roll your shoulders and bend at the waist when doing this or you will stretch your lower back instead. Hold the stretch for 15 to 30 seconds. Repeat 3 times. Repeat the same stretch on your other leg.  Cat and camel: Get down on your hands and knees. Let your stomach sag, allowing your back to curve downward. Hold this position for 5 seconds. Then arch your back and hold for 5 seconds. Do 3 sets of 10.  Quadriped Arm/Leg Raises: Get down on your hands and knees. Tighten your abdominal muscles to stiffen your spine. While keeping your abdominals tight, raise one arm and the opposite leg away from you. Hold this position for 5 seconds. Lower your arm and leg slowly and alternate sides. Do this 10 times on each side.  Pelvic tilt: Lie on your back with your knees bent and your feet flat on the floor. Tighten your abdominal muscles and push your lower back into the floor. Hold this position for 5 seconds, then relax. Do 3 sets of 10.  Partial curl: Lie on your back with your knees bent and your feet flat on the floor. Tighten your stomach muscles and flatten your back against the floor. Tuck your chin to your chest. With your hands  stretched out in front of you, curl your upper body forward until your shoulders clear the floor. Hold this position for 3 seconds. Don't hold your breath. It helps to breathe out as you lift your shoulders up. Relax. Repeat 10 times. Build to 3 sets of 10. To challenge yourself, clasp your hands behind your head and keep your elbows out to the side.  Lower trunk rotation: Lie on your back with your knees bent and your feet flat on the floor. Tighten your abdominal muscles and push your lower back into the floor. Keeping your shoulders down flat, gently rotate your legs to one side, then the other as far as you can. Repeat 10 to 20 times.  Single knee to chest stretch: Lie  on your back with your legs straight out in front of you. Bring one knee up to your chest and grasp the back of your thigh. Pull your knee toward your chest, stretching your buttock muscle. Hold this position for 15 to 30 seconds and return to the starting position. Repeat 3 times on each side.  Double knee to chest: Lie on your back with your knees bent and your feet flat on the floor. Tighten your abdominal muscles and push your lower back into the floor. Pull both knees up to your chest. Hold for 5 seconds and repeat 10 to 20 times.

## 2020-10-04 ENCOUNTER — Telehealth: Payer: Self-pay | Admitting: Urology

## 2020-10-04 NOTE — Telephone Encounter (Signed)
Dr. Althea Charon asked if we would see him again for his scrotal pain.  Would you call him and get him scheduled?

## 2020-10-07 ENCOUNTER — Ambulatory Visit (INDEPENDENT_AMBULATORY_CARE_PROVIDER_SITE_OTHER): Payer: BC Managed Care – PPO | Admitting: Urology

## 2020-10-07 ENCOUNTER — Other Ambulatory Visit: Payer: Self-pay

## 2020-10-07 ENCOUNTER — Other Ambulatory Visit: Payer: BC Managed Care – PPO

## 2020-10-07 ENCOUNTER — Encounter: Payer: Self-pay | Admitting: Urology

## 2020-10-07 VITALS — BP 132/80 | HR 90 | Ht 69.0 in | Wt 155.0 lb

## 2020-10-07 DIAGNOSIS — N50819 Testicular pain, unspecified: Secondary | ICD-10-CM

## 2020-10-07 DIAGNOSIS — G8929 Other chronic pain: Secondary | ICD-10-CM | POA: Diagnosis not present

## 2020-10-07 DIAGNOSIS — R109 Unspecified abdominal pain: Secondary | ICD-10-CM

## 2020-10-07 DIAGNOSIS — R3129 Other microscopic hematuria: Secondary | ICD-10-CM

## 2020-10-07 DIAGNOSIS — M2550 Pain in unspecified joint: Secondary | ICD-10-CM | POA: Diagnosis not present

## 2020-10-07 NOTE — Progress Notes (Signed)
10/07/2020 1:13 PM   Carlos Newman 19-May-2001 [id number]  Referring provider: Tarri Fuller, FNP No address on file  Chief Complaint  Patient presents with  . Testicle Pain   Urological history: 1. High risk hematuria -smoker -gross heme in 2016 -RUS 2020 NED -contrast CT 2021 NED -cysto NED -UA 3-10 RBC's  2. Scrotal pain -scrotal ultrasound 2021 NED  HPI: Carlos Newman is a 20 y.o. male who presents today for further evaluation for his scrotal pain.  He continues to experience chronic testicular symptoms over the last 2 years.  He has been evaluated at [facility] and also GI for symptomatology without any specific etiology found to cause the symptoms.  He has recently noticed a soft raised lump in his right scrotal cord.  He also states that when he sleeps on his side with his legs crossed it is causing more pain to his testicles as they get talked into his body.  He does not endorse any retractile movement of the testicles into the inguinal area.  Patient denies any modifying or aggravating factors.  Patient denies any gross hematuria, dysuria or suprapubic/flank pain.  Patient denies any fevers, chills, nausea or vomiting.   UA 3-10 RBC's.    PMH: Past Medical History:  Diagnosis Date  . Hematuria     Surgical History: Past Surgical History:  Procedure Laterality Date  . FOOT SURGERY     Left   . WISDOM TOOTH EXTRACTION      Home Medications:  Allergies as of 10/07/2020      Reactions   Penicillin G Itching, Other (See Comments)   Penicillin V Potassium Other (See Comments)      Medication List       Accurate as of October 07, 2020 11:59 PM. If you have any questions, ask your nurse or doctor.        baclofen 10 MG tablet Commonly known as: LIORESAL Take 0.5-1 tablets (5-10 mg total) by mouth 3 (three) times daily as needed for muscle spasms.       Allergies:  Allergies  Allergen Reactions  . Penicillin G Itching and Other  (See Comments)  . Penicillin V Potassium Other (See Comments)    Family History: Family History  Problem Relation Age of Onset  . Hematuria Paternal Aunt   . Healthy Mother   . Healthy Father   . Healthy Sister   . Healthy Sister     Social History:  reports that he has been smoking cigarettes. He has been smoking about 0.10 packs per day. He has quit using smokeless tobacco.  His smokeless tobacco use included chew. He reports current alcohol use. He reports current drug use. Drug: Marijuana.  ROS: Pertinent review of systems please refer to history of present illness  Physical Exam: BP 132/80   Pulse 90   Ht 5\' 9"  (1.753 m)   Wt 155 lb (70.3 kg)   BMI 22.89 kg/m   Constitutional:  Well nourished. Alert and oriented, No acute distress. HEENT: [place] AT, mask in place.  Trachea midline Cardiovascular: No clubbing, cyanosis, or edema. Respiratory: Normal respiratory effort, no increased work of breathing. GU: No CVA tenderness.  No bladder fullness or masses.  Patient with circumcised phallus. Urethral meatus is patent.  No penile discharge. No penile lesions or rashes. Scrotum without lesions, cysts, rashes and/or edema.  Testicles are located scrotally bilaterally. No masses are appreciated in the testicles. Left and right epididymis are normal.  Testicles do  not rise into the inguinal canal on Valsalva.  Normal cremasteric reflexes bilaterally. Neurologic: Grossly intact, no focal deficits, moving all 4 extremities. Psychiatric: Normal mood and affect.  Laboratory Data: Lab Results  Component Value Date   WBC 6.2 10/07/2020   HGB 15.1 10/07/2020   HCT 46.4 10/07/2020   MCV 93.0 10/07/2020   PLT 233 10/07/2020    Lab Results  Component Value Date   CREATININE 0.94 10/07/2020    Lab Results  Component Value Date   AST 17 10/07/2020   Lab Results  Component Value Date   ALT 18 10/07/2020    Urinalysis Component     Latest Ref Rng & Units 10/07/2020  Specific  Gravity, UA     1.005 - 1.030 1.015  pH, UA     5.0 - 7.5 6.0  Color, UA     Yellow Yellow  Appearance Ur     Clear Clear  Leukocytes,UA     Negative Negative  Protein,UA     Negative/Trace Negative  Glucose, UA     Negative Negative  Ketones, UA     Negative Negative  RBC, UA     Negative 2+ (A)  Bilirubin, UA     Negative Negative  Urobilinogen, Ur     0.2 - 1.0 mg/dL 0.2  Nitrite, UA     Negative Negative  Microscopic Examination      See below:   Component     Latest Ref Rng & Units 10/07/2020         3:32 PM  WBC, UA     0 - 5 /hpf None seen  RBC     0 - 2 /hpf 3-10 (A)  Epithelial Cells (non renal)     0 - 10 /hpf None seen  Bacteria, UA     None seen/Few None seen  I have reviewed the labs.   Pertinent Imaging: N/A   Assessment & Plan:    1. Testicular pain  -Scrotal ultrasounds x 2 - normal  -Contrast CT 06/2019 - negative -Inflammatory markers are still pending from PCPs office -He was switched to more supportive underwear -I will also have him eliminate or greatly reduce his smoking/marijuana use  2. Microscopic hematuria -Contrast CT 07/01/2019 NED -Scrotal ultrasounds x 2 benign  -UA today 3-10 RBC's  Return for pending labs.  These notes generated with voice recognition software. I apologize for typographical errors.  Michiel Cowboy, PA-C  [facility] [address]  [address] [place], [place] [address] [phone]

## 2020-10-08 LAB — URINALYSIS, COMPLETE
Bilirubin, UA: NEGATIVE
Glucose, UA: NEGATIVE
Ketones, UA: NEGATIVE
Leukocytes,UA: NEGATIVE
Nitrite, UA: NEGATIVE
Protein,UA: NEGATIVE
Specific Gravity, UA: 1.015 (ref 1.005–1.030)
Urobilinogen, Ur: 0.2 mg/dL (ref 0.2–1.0)
pH, UA: 6 (ref 5.0–7.5)

## 2020-10-08 LAB — COMPLETE METABOLIC PANEL WITH GFR
AG Ratio: 2.1 (calc) (ref 1.0–2.5)
ALT: 18 U/L (ref 9–46)
AST: 17 U/L (ref 10–40)
Albumin: 4.5 g/dL (ref 3.6–5.1)
Alkaline phosphatase (APISO): 46 U/L (ref 36–130)
BUN: 12 mg/dL (ref 7–25)
CO2: 30 mmol/L (ref 20–32)
Calcium: 9.7 mg/dL (ref 8.6–10.3)
Chloride: 104 mmol/L (ref 98–110)
Creat: 0.94 mg/dL (ref 0.60–1.35)
GFR, Est African American: 133 mL/min/{1.73_m2} (ref >=60)
GFR, Est Non African American: 115 mL/min/{1.73_m2} (ref >=60)
Globulin: 2.1 g/dL (calc) (ref 1.9–3.7)
Glucose, Bld: 116 mg/dL — ABNORMAL HIGH (ref 65–99)
Potassium: 4.4 mmol/L (ref 3.5–5.3)
Sodium: 139 mmol/L (ref 135–146)
Total Bilirubin: 0.4 mg/dL (ref 0.2–1.2)
Total Protein: 6.6 g/dL (ref 6.1–8.1)

## 2020-10-08 LAB — C-REACTIVE PROTEIN: CRP: 0.4 mg/L (ref <8.0)

## 2020-10-08 LAB — CBC WITH DIFFERENTIAL/PLATELET
Absolute Monocytes: 409 cells/uL (ref 200–950)
Basophils Absolute: 68 cells/uL (ref 0–200)
Basophils Relative: 1.1 %
Eosinophils Absolute: 360 cells/uL (ref 15–500)
Eosinophils Relative: 5.8 %
HCT: 46.4 % (ref 38.5–50.0)
Hemoglobin: 15.1 g/dL (ref 13.2–17.1)
Lymphs Abs: 2300 cells/uL (ref 850–3900)
MCH: 30.3 pg (ref 27.0–33.0)
MCHC: 32.5 g/dL (ref 32.0–36.0)
MCV: 93 fL (ref 80.0–100.0)
MPV: 10.7 fL (ref 7.5–12.5)
Monocytes Relative: 6.6 %
Neutro Abs: 3063 cells/uL (ref 1500–7800)
Neutrophils Relative %: 49.4 %
Platelets: 233 10*3/uL (ref 140–400)
RBC: 4.99 10*6/uL (ref 4.20–5.80)
RDW: 11.8 % (ref 11.0–15.0)
Total Lymphocyte: 37.1 %
WBC: 6.2 10*3/uL (ref 3.8–10.8)

## 2020-10-08 LAB — MICROSCOPIC EXAMINATION
Bacteria, UA: NONE SEEN
Epithelial Cells (non renal): NONE SEEN /hpf (ref 0–10)
WBC, UA: NONE SEEN /hpf (ref 0–5)

## 2020-10-08 LAB — B. BURGDORFI ANTIBODIES: B burgdorferi Ab IgG+IgM: <0.9 index

## 2020-10-08 LAB — SEDIMENTATION RATE: Sed Rate: 2 mm/h (ref 0–15)

## 2020-10-08 LAB — ANA: Anti Nuclear Antibody (ANA): NEGATIVE

## 2020-10-08 LAB — THYROID PANEL WITH TSH
Free Thyroxine Index: 2.4 (ref 1.4–3.8)
T3 Uptake: 33 % (ref 22–35)
T4, Total: 7.3 ug/dL (ref 4.9–10.5)
TSH: 0.89 mIU/L (ref 0.40–4.50)

## 2020-10-28 ENCOUNTER — Telehealth: Payer: Self-pay | Admitting: Urology

## 2020-10-28 NOTE — Telephone Encounter (Signed)
Spoke with patient and advised results, he is still smoking just FYI.

## 2020-10-28 NOTE — Telephone Encounter (Signed)
Would you please let Carlos Newman know that most of his blood work has returned within normal limits?  I am wondering if he reducing or even eliminating the smoking and marijuana have improved his symptoms

## 2021-06-03 IMAGING — US ULTRASOUND SCROTUM DOPPLER COMPLETE
1 series · 14 of 25 positions shown · non-contrast
Comparison: None

CLINICAL DATA: LEFT testicular pain

EXAM:
SCROTAL ULTRASOUND
DOPPLER ULTRASOUND OF THE TESTICLES
TECHNIQUE: Complete ultrasound examination of the testicles, epididymis, and
other scrotal structures was performed. Color and spectral Doppler
ultrasound were also utilized to evaluate blood flow to the
testicles.

[Series 1: ultrasound scrotum doppler complete · 50 acquisitions, 14 frames shown]
[im 1/50]
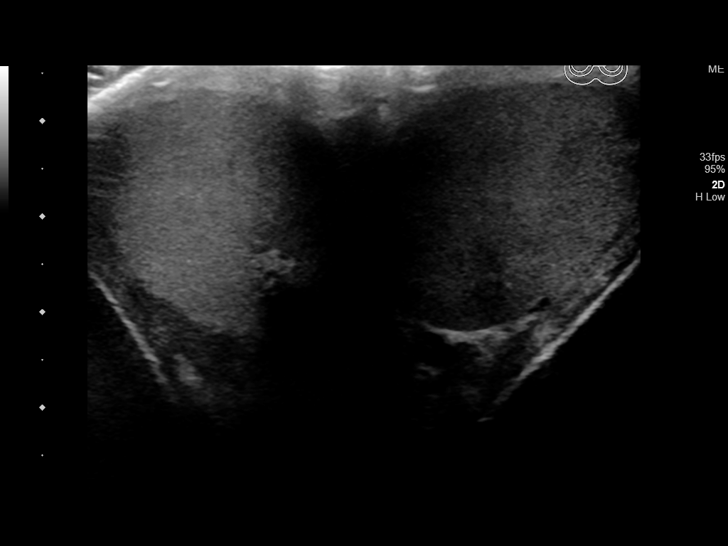
[im 5/50]
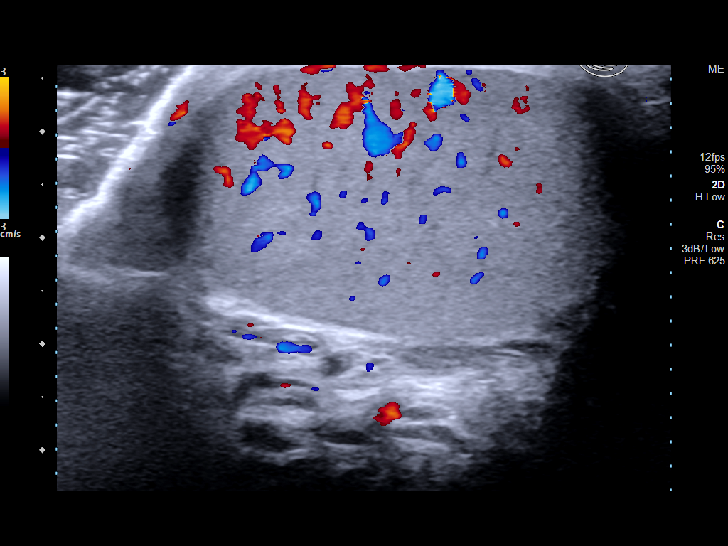
[im 9/50]
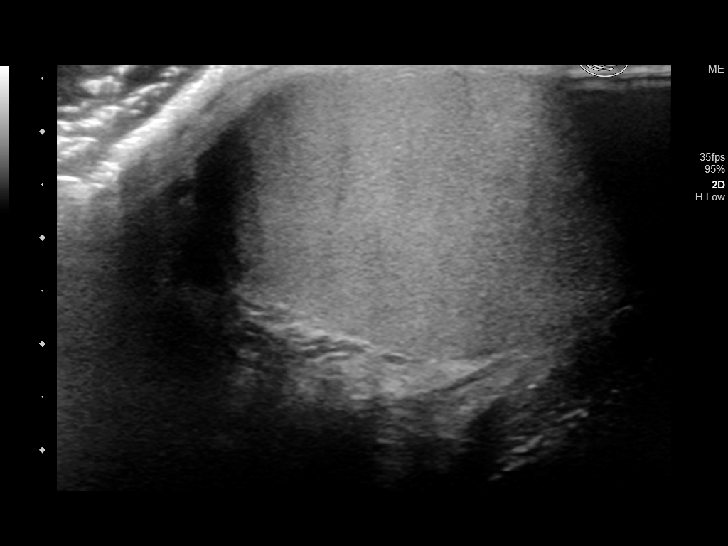
[im 13/50]
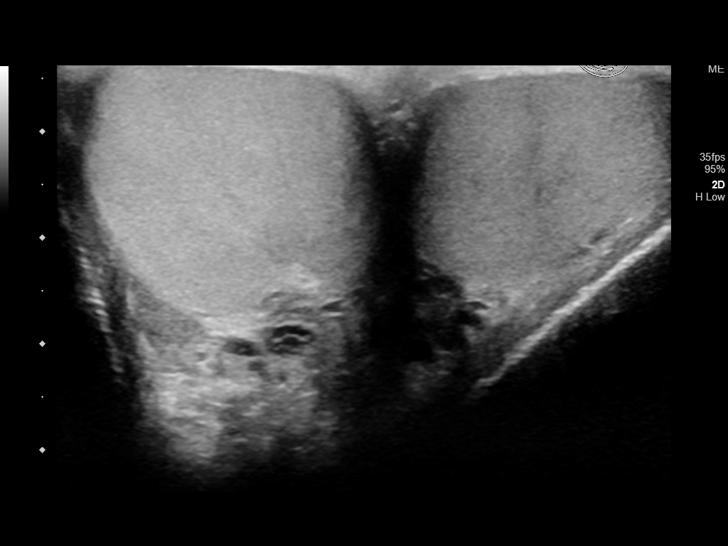
[im 17/50]
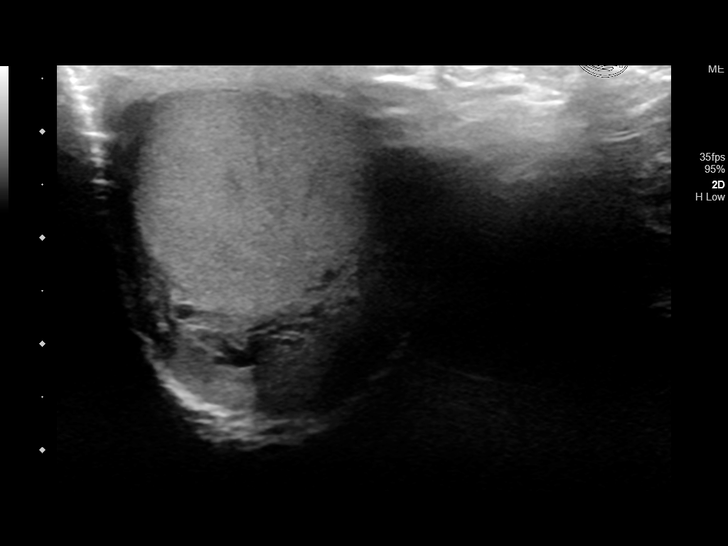
[im 19/50]
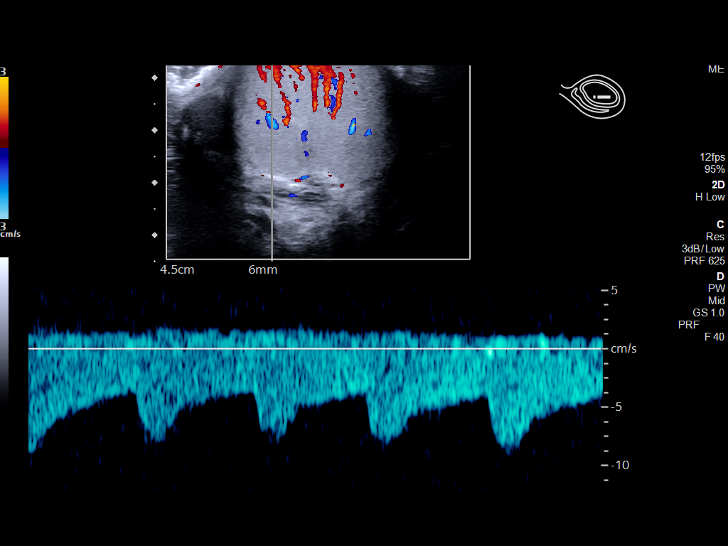
[im 23/50]
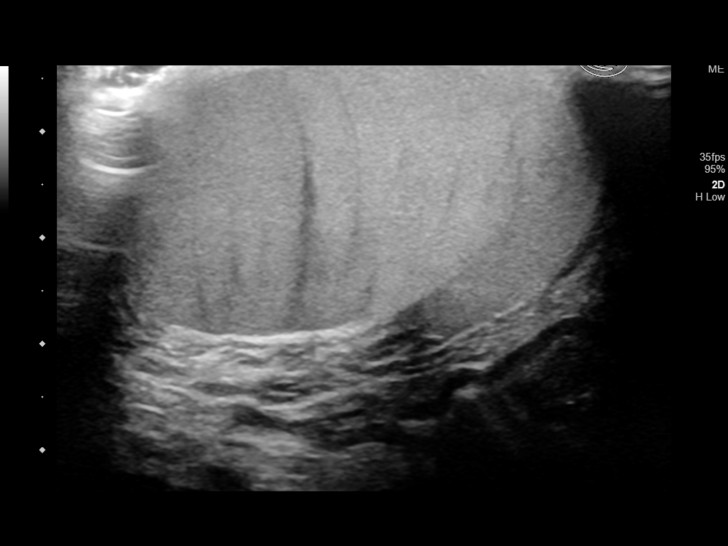
[im 27/50]
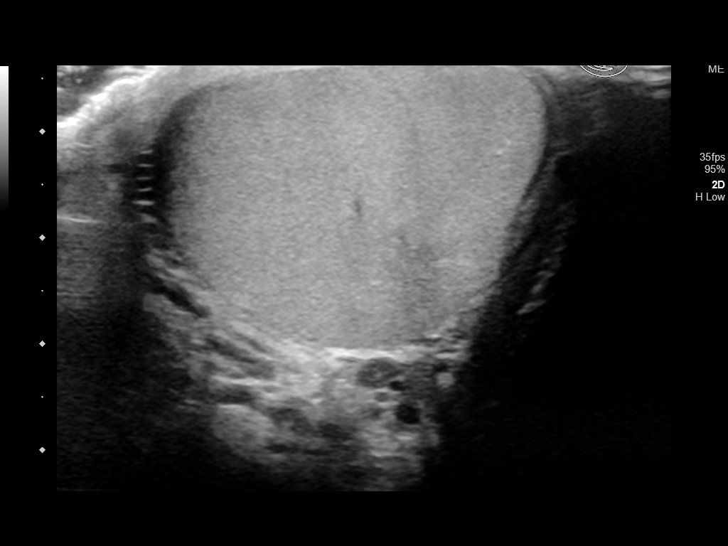
[im 31/50]
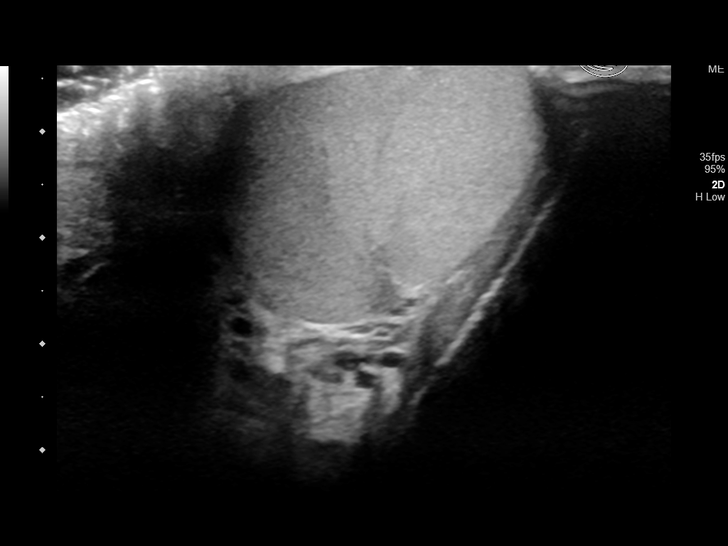
[im 33/50]
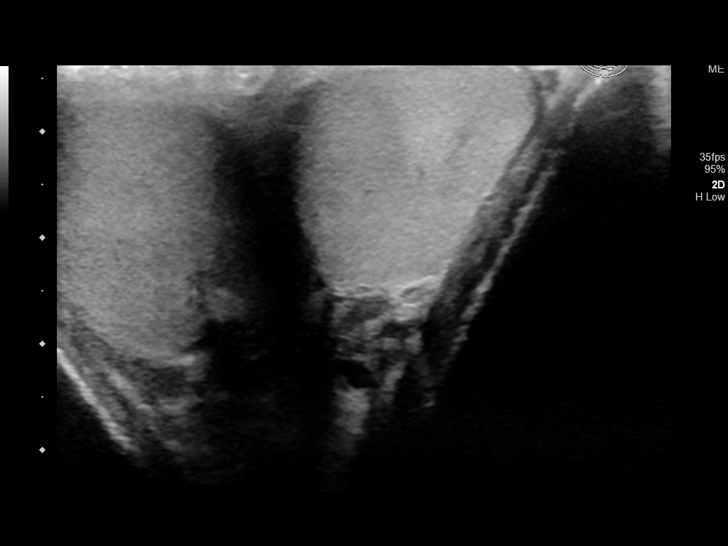
[im 37/50]
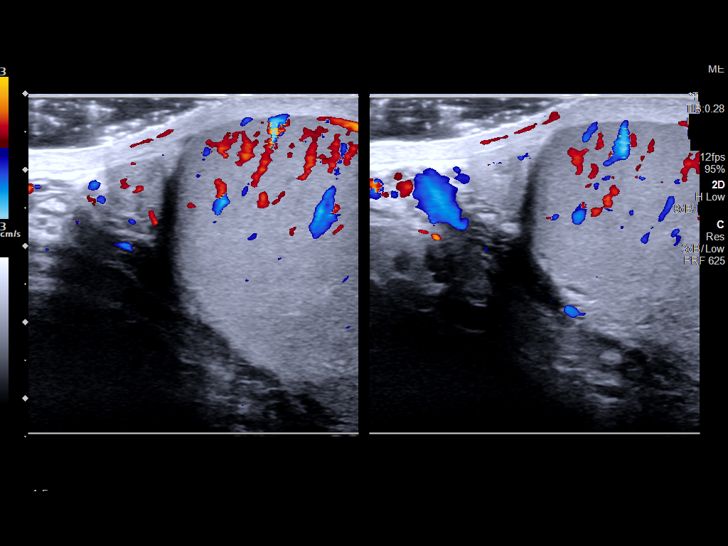
[im 41/50]
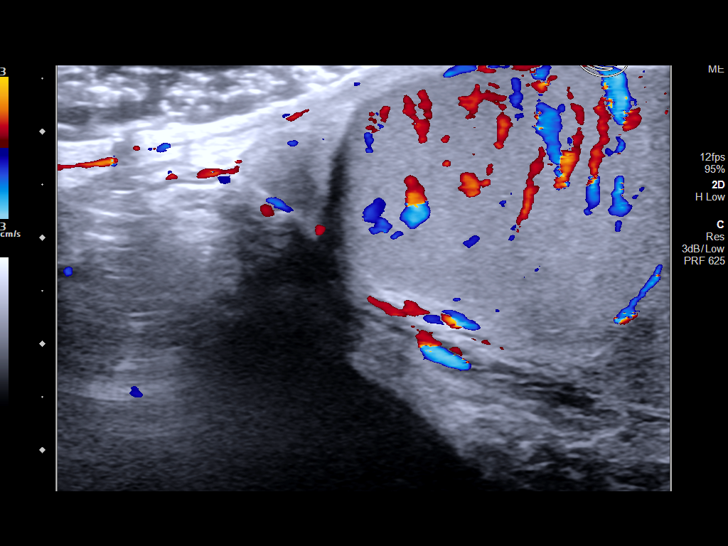
[im 45/50]
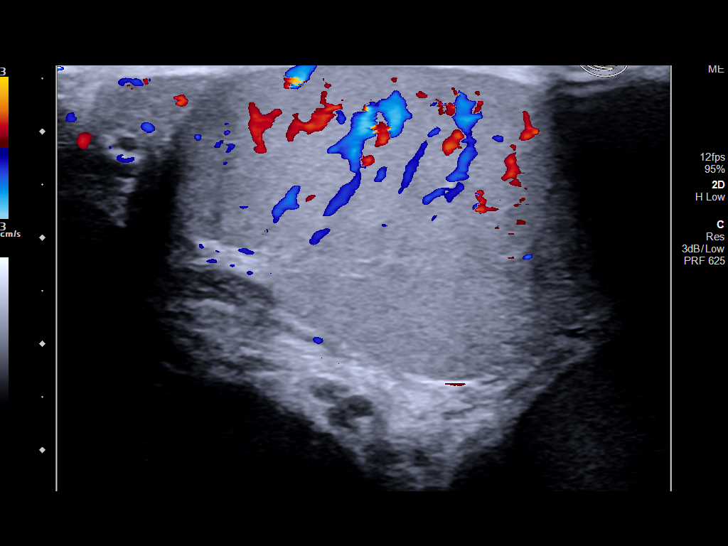
[im 50/50]
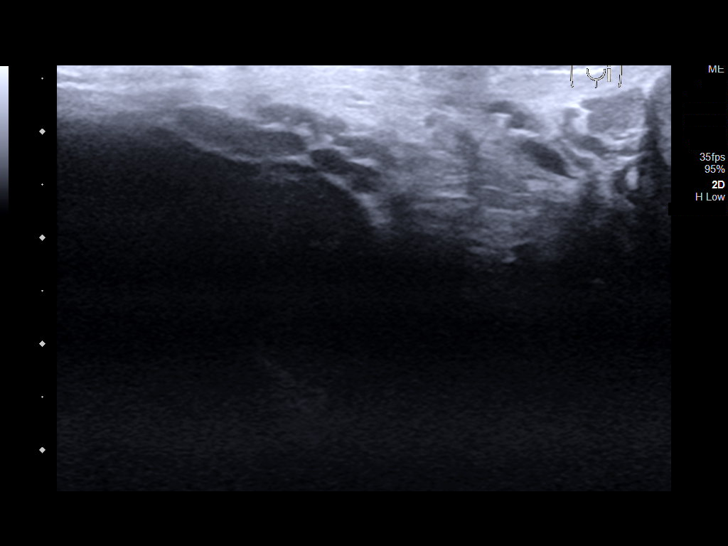

[14 of 25 positions shown; findings below may reference images not displayed]

FINDINGS: Right testicle

Measurements: 4.7 x 2.4 x 3.6 cm. Normal echogenicity without mass
or calcification. Internal blood flow present on color Doppler
imaging.

Left testicle

Measurements: 4.7 x 2.9 x 3.5 cm. Normal echogenicity without mass
or calcification. Internal blood flow present on color Doppler
imaging.

Right epididymis:  Normal in size and appearance.

Left epididymis:  Normal in size and appearance.

Hydrocele:  None visualized.

Varicocele:  None visualized.

Pulsed Doppler interrogation of both testes demonstrates normal low
resistance arterial and venous waveforms bilaterally.
IMPRESSION: Normal exam.

## 2021-07-12 DIAGNOSIS — A630 Anogenital (venereal) warts: Secondary | ICD-10-CM | POA: Diagnosis not present

## 2021-11-03 ENCOUNTER — Encounter: Payer: Self-pay | Admitting: Urology

## 2021-11-03 ENCOUNTER — Ambulatory Visit (INDEPENDENT_AMBULATORY_CARE_PROVIDER_SITE_OTHER): Payer: BC Managed Care – PPO | Admitting: Urology

## 2021-11-03 VITALS — BP 132/82 | HR 83 | Ht 69.0 in | Wt 152.0 lb

## 2021-11-03 DIAGNOSIS — L723 Sebaceous cyst: Secondary | ICD-10-CM

## 2021-11-03 DIAGNOSIS — N50819 Testicular pain, unspecified: Secondary | ICD-10-CM

## 2021-11-03 DIAGNOSIS — N5089 Other specified disorders of the male genital organs: Secondary | ICD-10-CM

## 2021-11-03 LAB — MICROSCOPIC EXAMINATION
Bacteria, UA: NONE SEEN
Epithelial Cells (non renal): NONE SEEN /hpf (ref 0–10)
RBC, Urine: NONE SEEN /hpf (ref 0–2)
WBC, UA: NONE SEEN /hpf (ref 0–5)

## 2021-11-03 LAB — URINALYSIS, COMPLETE
Bilirubin, UA: NEGATIVE
Glucose, UA: NEGATIVE
Ketones, UA: NEGATIVE
Leukocytes,UA: NEGATIVE
Nitrite, UA: NEGATIVE
Protein,UA: NEGATIVE
RBC, UA: NEGATIVE
Specific Gravity, UA: 1.02 (ref 1.005–1.030)
Urobilinogen, Ur: 0.2 mg/dL (ref 0.2–1.0)
pH, UA: 7.5 (ref 5.0–7.5)

## 2021-11-03 NOTE — Progress Notes (Signed)
11/03/21 12:13 PM   Carlos Newman 06/19/2000 [id number]  Referring provider:  Tarri Fuller, FNP No address on file  Urological history  1. High risk hematuria -smoker -gross heme in 2016 -RUS 2020 NED -contrast CT 2021 NED -cysto 2021 NED - no gross heme - UA negative for micro heme   2. Scrotal pain -scrotal ultrasound x2 2021 NED  Chief Complaint  Patient presents with   Testicle Pain      HPI: Carlos Newman is a 21 y.o.male who presents today for further evaluation of scrotal pain with soft raised lump.  He circled frequent urination, lower abdominal pain and genital pain on his ROS sheet.   UA negative.   He states he continues to experience the lower abdominal pain on intermittent basis along with the testicular pain for the last 2 to 5 years, but recently he started to notice a lump in his right testicle.  His right scrotum is swollen and tight in the morning but as the day goes on, the scrotal sac relaxes, the swelling decreases and then he can feel the lump.  He also says has been doing research and is concerned he may have either epididymitis, epididymal orchitis, prostatitis, STI, etc.  He is frustrated as he has been having symptoms and no one has discovered a reason over the last several years.  He also has noted a lump in his penis which he states the dermatologist told him it was nothing to worry about.  He had discontinued his marijuana use for a month and did not see any change in his pain.  He has no issues with erections or ejaculation.  He has noted he is starting to get up twice at night.  Patient denies any modifying or aggravating factors.  Patient denies any gross hematuria, dysuria or suprapubic/flank pain.  Patient denies any fevers, chills, nausea or vomiting.    PMH: Past Medical History:  Diagnosis Date   Hematuria     Surgical History: Past Surgical History:  Procedure Laterality Date   FOOT SURGERY     Left     WISDOM TOOTH EXTRACTION      Home Medications:  Allergies as of 11/03/2021       Reactions   Penicillin G Itching, Other (See Comments)   Penicillin V Potassium Other (See Comments)        Medication List        Accurate as of Nov 03, 2021 12:13 PM. If you have any questions, ask your nurse or doctor.          baclofen 10 MG tablet Commonly known as: LIORESAL Take 0.5-1 tablets (5-10 mg total) by mouth 3 (three) times daily as needed for muscle spasms.        Allergies:  Allergies  Allergen Reactions   Penicillin G Itching and Other (See Comments)   Penicillin V Potassium Other (See Comments)    Family History: Family History  Problem Relation Age of Onset   Hematuria Paternal Aunt    Healthy Mother    Healthy Father    Healthy Sister    Healthy Sister     Social History:  reports that he has been smoking cigarettes. He has been smoking an average of .1 packs per day. He has quit using smokeless tobacco.  His smokeless tobacco use included chew. He reports current alcohol use. He reports current drug use. Drug: Marijuana.   Physical Exam: BP 132/82   Pulse 83  Ht 5\' 9"  (1.753 m)   Wt 152 lb (68.9 kg)   BMI 22.45 kg/m   Constitutional:  Alert and oriented, No acute distress. HEENT: [place] AT, moist mucus membranes.  Trachea midline Cardiovascular: No clubbing, cyanosis, or edema. Respiratory: Normal respiratory effort, no increased work of breathing. GU:  No CVA tenderness.  No bladder fullness or masses.  Patient with circumcised phallus. Urethral meatus is patent.  No penile discharge. No penile lesions or rashes.  The area of concern is on the ventral side of his penis midshaft and it appears to be an occluded sebaceous cyst that is not infected at this time.  Scrotum without lesions, cysts, rashes and/or edema.  Bilateral hydroceles, testicles are located scrotally bilaterally. No masses are appreciated in the testicles. Left and right epididymis are  normal.  A pea-sized lump is palpated with my index finger during the exam, but I am unable to trap it to get a better feeling of the lump.  Skin: No rashes, bruises or suspicious lesions. Neurologic: Grossly intact, no focal deficits, moving all 4 extremities. Psychiatric: Normal mood and affect, but anxious   Laboratory Data: Lab Results  Component Value Date   CREATININE 0.94 10/07/2020   Component     Latest Ref Rng 10/07/2020  Sodium     135 - 146 mmol/L 139   Potassium     3.5 - 5.3 mmol/L 4.4   Chloride     98 - 110 mmol/L 104   CO2     20 - 32 mmol/L 30   Glucose     65 - 99 mg/dL 161116 (H)   BUN     7 - 25 mg/dL 12   Creatinine     0.960.60 - 1.35 mg/dL 0.450.94   Total Bilirubin     0.2 - 1.2 mg/dL 0.4   AST     10 - 40 U/L 17   ALT     9 - 46 U/L 18   Total Protein     6.1 - 8.1 g/dL 6.6   Calcium     8.6 - 10.3 mg/dL 9.7   GFR, Est African American     > OR = 60 mL/min/1.7273m2 133   GFR, Est Non African American     > OR = 60 mL/min/1.6373m2 115   BUN/Creatinine Ratio     6 - 22 (calc) NOT APPLICABLE   Albumin MSPROF     3.6 - 5.1 g/dL 4.5   Globulin     1.9 - 3.7 g/dL (calc) 2.1   AG Ratio     1.0 - 2.5 (calc) 2.1   Alkaline phosphatase (APISO)     36 - 130 U/L 46     (H) High Component     Latest Ref Rng 10/07/2020  WBC     3.8 - 10.8 Thousand/uL 6.2   RBC     0 - 2 /hpf 3-10 !   RBC      4.99   Hemoglobin     13.2 - 17.1 g/dL 40.915.1   HCT     81.138.5 - 91.450.0 % 46.4   MCV     80.0 - 100.0 fL 93.0   MCH     27.0 - 33.0 pg 30.3   MCHC     32.0 - 36.0 g/dL 78.232.5   RDW     95.611.0 - 21.315.0 % 11.8   Platelets     140 - 400 Thousand/uL 233   MPV  7.5 - 12.5 fL 10.7   NEUT#     1,500 - 7,800 cells/uL 3,063   Lymphocyte #     850 - 3,900 cells/uL 2,300   Monocyte #     0.2 - 1.0 K/uL   Eosinophils Absolute     15 - 500 cells/uL 360   Basophils Absolute     0 - 200 cells/uL 68   Neutrophils     % 49.4   Lymphocytes     %   Monocytes Relative      % 6.6   Eosinophil     % 5.8   Basophil     % 1.1   Smear Review   WBC mixed population     200 - 900 cells/uL   Total Lymphocyte     % 37.1   WBC, UA     0 - 5 /hpf None seen   Epithelial Cells (non renal)     0 - 10 /hpf None seen   Crystals     N/A    Crystal Type     N/A    Bacteria, UA     None seen/Few  None seen   Absolute Monocytes     200 - 950 cells/uL 409     Urinalysis See Epic I have reviewed the labs.   Pertinent Imaging: N/A  Assessment & Plan:    1.  Right scrotal pain -Exam today is benign but patient is quite anxious so we will get a repeat scrotal ultrasound -UA is negative -Urine is sent for mycoplasma and Ureaplasma and culture for completeness -If cultures and scrotal ultrasound are negative, may consider spermatic cord block for further evaluation  2. Testicular lump -Not appreciated fully on today's exam  3.  Sebaceous cyst -Explained to patient that the area in the penis is just an occluded sebaceous cyst and advised him to wash the area with benzyl peroxide  Return for pending culture results and scrotal ultrasound .  [facility] [address] [place], [place] [address] [phone]  I, Nemiah Commander, acting as a Neurosurgeon for [facility], PA-C.,have documented all relevant documentation on the behalf of Auriana Scalia, PA-C,as directed by  [facility], PA-C while in the presence of Farrell Pantaleo, PA-C.  I have reviewed the above documentation for accuracy and completeness, and I agree with the above.    Michiel Cowboy, PA-C

## 2021-11-07 LAB — CULTURE, URINE COMPREHENSIVE

## 2021-11-10 ENCOUNTER — Telehealth: Payer: Self-pay

## 2021-11-10 LAB — MYCOPLASMA / UREAPLASMA CULTURE
Mycoplasma hominis Culture: NEGATIVE
Ureaplasma urealyticum: NEGATIVE

## 2021-11-10 NOTE — Telephone Encounter (Signed)
Attempted to reach pt, VM full. Notifying via Mychart.

## 2021-11-10 NOTE — Telephone Encounter (Signed)
-----   Message from Harle Battiest, PA-C sent at 11/10/2021  8:07 AM EDT ----- Please let Carlos Newman know that his cultures were negative.

## 2021-11-14 ENCOUNTER — Ambulatory Visit: Payer: BC Managed Care – PPO

## 2023-11-20 ENCOUNTER — Ambulatory Visit (INDEPENDENT_AMBULATORY_CARE_PROVIDER_SITE_OTHER)

## 2023-11-20 ENCOUNTER — Ambulatory Visit: Payer: Self-pay | Admitting: Emergency Medicine

## 2023-11-20 ENCOUNTER — Ambulatory Visit
Admission: EM | Admit: 2023-11-20 | Discharge: 2023-11-20 | Disposition: A | Discharge status: Home / Self Care | Attending: Emergency Medicine | Admitting: Emergency Medicine

## 2023-11-20 ENCOUNTER — Ambulatory Visit: Payer: Self-pay

## 2023-11-20 DIAGNOSIS — J069 Acute upper respiratory infection, unspecified: Secondary | ICD-10-CM | POA: Insufficient documentation

## 2023-11-20 DIAGNOSIS — R0602 Shortness of breath: Secondary | ICD-10-CM

## 2023-11-20 DIAGNOSIS — R002 Palpitations: Secondary | ICD-10-CM | POA: Insufficient documentation

## 2023-11-20 LAB — CBC WITH DIFFERENTIAL/PLATELET
Abs Immature Granulocytes: 0.03 10*3/uL (ref 0.00–0.07)
Basophils Absolute: 0.1 10*3/uL (ref 0.0–0.1)
Basophils Relative: 1 %
Eosinophils Absolute: 0.1 10*3/uL (ref 0.0–0.5)
Eosinophils Relative: 1 %
HCT: 45.7 % (ref 39.0–52.0)
Hemoglobin: 15.8 g/dL (ref 13.0–17.0)
Immature Granulocytes: 0 %
Lymphocytes Relative: 19 %
Lymphs Abs: 1.7 10*3/uL (ref 0.7–4.0)
MCH: 31.3 pg (ref 26.0–34.0)
MCHC: 34.6 g/dL (ref 30.0–36.0)
MCV: 90.7 fL (ref 80.0–100.0)
Monocytes Absolute: 0.8 10*3/uL (ref 0.1–1.0)
Monocytes Relative: 9 %
Neutro Abs: 6.3 10*3/uL (ref 1.7–7.7)
Neutrophils Relative %: 70 %
Platelets: 253 10*3/uL (ref 150–400)
RBC: 5.04 MIL/uL (ref 4.22–5.81)
RDW: 11.8 % (ref 11.5–15.5)
WBC: 9 10*3/uL (ref 4.0–10.5)
nRBC: 0 % (ref 0.0–0.2)

## 2023-11-20 LAB — MAGNESIUM: Magnesium: 2.1 mg/dL (ref 1.7–2.4)

## 2023-11-20 LAB — COMPREHENSIVE METABOLIC PANEL WITH GFR
ALT: 44 U/L (ref 0–44)
AST: 29 U/L (ref 15–41)
Albumin: 5 g/dL (ref 3.5–5.0)
Alkaline Phosphatase: 51 U/L (ref 38–126)
Anion gap: 10 (ref 5–15)
BUN: 14 mg/dL (ref 6–20)
CO2: 26 mmol/L (ref 22–32)
Calcium: 9.8 mg/dL (ref 8.9–10.3)
Chloride: 100 mmol/L (ref 98–111)
Creatinine, Ser: 0.81 mg/dL (ref 0.61–1.24)
GFR, Estimated: >60 mL/min (ref >60)
Glucose, Bld: 101 mg/dL — ABNORMAL HIGH (ref 70–99)
Potassium: 4.1 mmol/L (ref 3.5–5.1)
Sodium: 136 mmol/L (ref 135–145)
Total Bilirubin: 0.5 mg/dL (ref 0.0–1.2)
Total Protein: 8.2 g/dL — ABNORMAL HIGH (ref 6.5–8.1)

## 2023-11-20 LAB — TSH: TSH: 2.093 u[IU]/mL (ref 0.350–4.500)

## 2023-11-20 MED ORDER — PROMETHAZINE-DM 6.25-15 MG/5ML PO SYRP: 5.0000 mL | ORAL_SOLUTION | Freq: Four times a day (QID) | ORAL | 0 refills | Status: AC | PRN

## 2023-11-20 MED ORDER — ALBUTEROL SULFATE HFA 108 (90 BASE) MCG/ACT IN AERS: 1.0000 | INHALATION_SPRAY | RESPIRATORY_TRACT | 0 refills | Status: AC | PRN

## 2023-11-20 MED ORDER — PREDNISONE 20 MG PO TABS: 40.0000 mg | ORAL_TABLET | Freq: Every day | ORAL | 0 refills | Status: AC

## 2023-11-20 MED ORDER — FLUTICASONE PROPIONATE 50 MCG/ACT NA SUSP: 2.0000 | Freq: Every day | NASAL | 0 refills | Status: AC

## 2023-11-20 MED ORDER — AEROCHAMBER MV MISC: 1 refills | Status: AC

## 2023-11-20 NOTE — Telephone Encounter (Signed)
 FYI Only or Action Required?: Action required by provider  Patient was last seen in primary care on . Called Nurse Triage reporting Shortness of Breath. Symptoms began several weeks ago. Interventions attempted: OTC medications:  Carlos Newman Symptoms are: unchanged. Cough, SOB. No PCP. Pt. Will go to UC.  Triage Disposition: See HCP Within 4 Hours (Or PCP Triage)  Patient/caregiver understands and will follow disposition?: Yes          Copied from CRM [id number]. Topic: Clinical - Red Word Triage >> Nov 20, 2023 12:09 PM Carlos Newman wrote: Red Word that prompted transfer to Nurse Triage: Patient is stating that he is having issue breathing and his hear rate has spiked. Got light headed and weak Reason for Disposition  [1] MILD difficulty breathing (e.g., minimal/no SOB at rest, SOB with walking, pulse <100) AND [2] NEW-onset or WORSE than normal  Answer Assessment - Initial Assessment Questions 1. RESPIRATORY STATUS: "Describe your breathing?" (e.g., wheezing, shortness of breath, unable to speak, severe coughing)      SOB 2. ONSET: "When did this breathing problem begin?"      Last week 3. PATTERN "Does the difficult breathing come and go, or has it been constant since it started?"      Comes and goes 4. SEVERITY: "How bad is your breathing?" (e.g., mild, moderate, severe)    - MILD: No SOB at rest, mild SOB with walking, speaks normally in sentences, can lie down, no retractions, pulse < 100.    - MODERATE: SOB at rest, SOB with minimal exertion and prefers to sit, cannot lie down flat, speaks in phrases, mild retractions, audible wheezing, pulse 100-120.    - SEVERE: Very SOB at rest, speaks in single words, struggling to breathe, sitting hunched forward, retractions, pulse > 120      Moderate 5. RECURRENT SYMPTOM: "Have you had difficulty breathing before?" If Yes, ask: "When was the last time?" and "What happened that time?"      No 6. CARDIAC HISTORY: "Do you have any history of heart  disease?" (e.g., heart attack, angina, bypass surgery, angioplasty)      No 7. LUNG HISTORY: "Do you have any history of lung disease?"  (e.g., pulmonary embolus, asthma, emphysema)     No 8. CAUSE: "What do you think is causing the breathing problem?"      unsure 9. OTHER SYMPTOMS: "Do you have any other symptoms? (e.g., dizziness, runny nose, cough, chest pain, fever)     Cough 10. O2 SATURATION MONITOR:  "Do you use an oxygen saturation monitor (pulse oximeter) at home?" If Yes, ask: "What is your reading (oxygen level) today?" "What is your usual oxygen saturation reading?" (e.g., 95%)       no 11. PREGNANCY: "Is there any chance you are pregnant?" "When was your last menstrual period?"       N/a 12. TRAVEL: "Have you traveled out of the country in the last month?" (e.g., travel history, exposures)       no  Protocols used: Breathing Difficulty-A-AH

## 2023-11-20 NOTE — ED Provider Notes (Signed)
 HPI  SUBJECTIVE:  Carlos Newman is a 23 y.o. male who presents with 2 episodes of shortness of breath, palpitations described as his heart "beating fast and hard", lightheadedness, nausea and entire body numbness lasting approximately 30 minutes for the past 2 weeks.  He had another 1 today, states it was more intense than the previous episode.  States he was unable to stand up while symptomatic due to the lightheadedness.  Both occurred at rest.  No chest pain, pressure, heaviness, diaphoresis, abdominal pain presyncope, syncope..  No calf pain, swelling, recent immobilization, surgery in the past 3 months, hemoptysis, exogenous estrogen, history of PE, DVT, cancer.  He smokes marijuana, last smoked hours before his symptoms started.  He has not changed frequency of his marijuana use.  He states that he had 3 weeks of drinking 5-6 IPAs per day on various fishing trips, and discontinued this about 4 days ago.  He drinks 2 to 3 cups of coffee per day, has not changed recently.  Denies supplements, other energy drinks.  States that he has reduced his nicotine intake over the past 4 days.  No aggravating or alleviating factors.  The symptoms resolve on their own.  He also reports 4 days of a cough productive of dark yellow sputum with nasal congestion, dark yellow rhinorrhea, wheezing and some shortness of breath with exertion today.  No sinus pain or pressure, fevers, facial swelling, upper dental pain.   He has a past medical history of cannabis hyperemesis syndrome, vapes nicotine, and anxiety.  No history of PE/DVT, MI, coronary disease, diabetes, hypertension, CVA, PAD/PVD, hypercholesterolemia, hypothyroidism, arrhythmia, atrial fibrillation, ventricular tachycardia.  Family history negative for early MI.  PCP: None.   Past Medical History:  Diagnosis Date   Hematuria     Past Surgical History:  Procedure Laterality Date   FOOT SURGERY     Left    WISDOM TOOTH EXTRACTION       Family History  Problem Relation Age of Onset   Hematuria Paternal Aunt    Healthy Mother    Healthy Father    Healthy Sister    Healthy Sister     Social History   Tobacco Use   Smoking status: Some Days    Current packs/day: 0.10    Types: Cigarettes   Smokeless tobacco: Former    Types: Engineer, drilling   Vaping status: Every Day   Substances: Nicotine, THC, Flavoring  Substance Use Topics   Alcohol use: Yes    Alcohol/week: 0.0 standard drinks of alcohol    Comment: social   Drug use: Yes    Types: Marijuana    No current facility-administered medications for this encounter.  Current Outpatient Medications:    albuterol  (VENTOLIN  HFA) 108 (90 Base) MCG/ACT inhaler, Inhale 1-2 puffs into the lungs every 4 (four) hours as needed for wheezing or shortness of breath., Disp: 1 each, Rfl: 0   fluticasone  (FLONASE ) 50 MCG/ACT nasal spray, Place 2 sprays into both nostrils daily., Disp: 16 g, Rfl: 0   predniSONE  (DELTASONE ) 20 MG tablet, Take 2 tablets (40 mg total) by mouth daily with breakfast for 5 days., Disp: 10 tablet, Rfl: 0   promethazine-dextromethorphan (PROMETHAZINE-DM) 6.25-15 MG/5ML syrup, Take 5 mLs by mouth 4 (four) times daily as needed for cough., Disp: 118 mL, Rfl: 0   Spacer/Aero-Holding Chambers (AEROCHAMBER MV) inhaler, Use as instructed, Disp: 1 each, Rfl: 1  Allergies  Allergen Reactions   Diethyl Toluamide (Deet)    Penicillin  G Itching and Other (See Comments)   Penicillin V Potassium Other (See Comments)     ROS  As noted in HPI.   Physical Exam  BP 129/86 (BP Location: Left Arm)   Pulse 88   Temp 97.8 F (36.6 C) (Oral)   Resp 18   SpO2 100%   Constitutional: Well developed, well nourished, no acute distress Eyes: PERRL, EOMI, conjunctiva normal bilaterally HENT: Normocephalic, atraumatic,mucus membranes moist.  Mild nasal congestion.  Erythematous, swollen turbinates.  No maxillary, frontal sinus tenderness.  Positive postnasal  drip. Neck: Thyroid  nontender, normal size. Respiratory: Clear to auscultation bilaterally, no rales, no wheezing, no rhonchi Cardiovascular: Normal rate and rhythm, no murmurs, no gallops, no rubs GI: Soft, nondistended, normal bowel sounds, nontender, no rebound, no guarding skin: No rash, skin intact Musculoskeletal: Calves symmetric, nontender, no edema  neurologic: Alert & oriented x 3, CN III-XII grossly intact, no motor deficits, sensation grossly intact.  No tremor upper extremities.[name]  Upper, lower extremity reflexes 2+/2+, brisk and equal bilaterally. Psychiatric: Speech and behavior appropriate   ED Course   Medications - No data to display  Orders Placed This Encounter  Procedures   DG Chest 2 View    Standing Status:   Standing    Number of Occurrences:   1    Reason for Exam (SYMPTOM  OR DIAGNOSIS REQUIRED):   Shortness of breath, rule out acute cardiopulmonary disease   CBC with Differential    Standing Status:   Standing    Number of Occurrences:   1   Comprehensive metabolic panel    Standing Status:   Standing    Number of Occurrences:   1   TSH    Standing Status:   Standing    Number of Occurrences:   1   Magnesium    Standing Status:   Standing    Number of Occurrences:   1   Ambulatory referral to Cardiology    Referral Priority:   Urgent    Referral Type:   Consultation    Referral Reason:   Specialty Services Required    Number of Visits Requested:   1   Nursing Communication Please set up with a PCP prior to discharge    Please set up with a PCP prior to discharge    Standing Status:   Standing    Number of Occurrences:   1   ED EKG    Standing Status:   Standing    Number of Occurrences:   1    Reason for Exam:   Shortness of breath   EKG 12-Lead    Standing Status:   Standing    Number of Occurrences:   1   Results for orders placed or performed during the hospital encounter of 11/20/23 (from the past 24 hours)  CBC with Differential      Status: None   Collection Time: 11/20/23  2:19 PM  Result Value Ref Range   WBC 9.0 4.0 - 10.5 K/uL   RBC 5.04 4.22 - 5.81 MIL/uL   Hemoglobin 15.8 13.0 - 17.0 g/dL   HCT 09.8 11.9 - 14.7 %   MCV 90.7 80.0 - 100.0 fL   MCH 31.3 26.0 - 34.0 pg   MCHC 34.6 30.0 - 36.0 g/dL   RDW 82.9 56.2 - 13.0 %   Platelets 253 150 - 400 K/uL   nRBC 0.0 0.0 - 0.2 %   Neutrophils Relative % 70 %   Neutro Abs 6.3  1.7 - 7.7 K/uL   Lymphocytes Relative 19 %   Lymphs Abs 1.7 0.7 - 4.0 K/uL   Monocytes Relative 9 %   Monocytes Absolute 0.8 0.1 - 1.0 K/uL   Eosinophils Relative 1 %   Eosinophils Absolute 0.1 0.0 - 0.5 K/uL   Basophils Relative 1 %   Basophils Absolute 0.1 0.0 - 0.1 K/uL   Immature Granulocytes 0 %   Abs Immature Granulocytes 0.03 0.00 - 0.07 K/uL  Comprehensive metabolic panel     Status: Abnormal   Collection Time: 11/20/23  2:19 PM  Result Value Ref Range   Sodium 136 135 - 145 mmol/L   Potassium 4.1 3.5 - 5.1 mmol/L   Chloride 100 98 - 111 mmol/L   CO2 26 22 - 32 mmol/L   Glucose, Bld 101 (H) 70 - 99 mg/dL   BUN 14 6 - 20 mg/dL   Creatinine, Ser 0.34 0.61 - 1.24 mg/dL   Calcium 9.8 8.9 - 74.2 mg/dL   Total Protein 8.2 (H) 6.5 - 8.1 g/dL   Albumin 5.0 3.5 - 5.0 g/dL   AST 29 15 - 41 U/L   ALT 44 0 - 44 U/L   Alkaline Phosphatase 51 38 - 126 U/L   Total Bilirubin 0.5 0.0 - 1.2 mg/dL   GFR, Estimated >59 >56 mL/min   Anion gap 10 5 - 15   DG Chest 2 View Result Date: 11/20/2023 CLINICAL DATA:  Shortness of breath, cough and congestion for 2 days. EXAM: CHEST - 2 VIEW COMPARISON:  None Available. FINDINGS: The heart size and mediastinal contours are within normal limits. No consolidation, pneumothorax or effusion. No edema. The visualized skeletal structures are unremarkable. IMPRESSION: No acute cardiopulmonary disease. Electronically Signed   By: Adrianna Horde M.D.   On: 11/20/2023 15:06    ED Clinical Impression  1. Shortness of breath   2. Palpitations   3. Acute upper  respiratory infection      ED Assessment/Plan     EKG: Normal sinus rhythm with sinus arrhythmia, rate 78.  Normal axis, incomplete right bundle branch block.  No ST-T wave changes.  No previous EKG for comparison.  Patient asymptomatic while EKG was obtained.  Reviewed imaging independently.  No acute cardiopulmonary disease. See radiology report for full details.  HEART score:   History: Slightly suspicious 0 EKG: Normal 0 Age: Below 45 0 Risk factors: Smoking +1 Troponin: Not available  Total score +1.  Patient is at low risk of MACE.   Wells score for PE 0.  CIWA score 3-I considered acute alcohol withdrawal since it has been over 72 hours since his last drink, but he is not tachycardic, has normal blood pressure, no tremor.   Differential includes electrolyte imbalance, hyperthyroidism, acute intoxication with caffeine, nicotine or other illicit substance, caffeine/illicit substance/THC/ nicotine/ alcohol withdrawal, pneumonia, anxiety/panic attack.  ACS, PE,  alcohol withdrawal low in the differential..  Checking CBC, CMP, chest x-ray, TSH and magnesium.  CBC, CMP unremarkable.  Magnesium, TSH pending at the time of signing of this note.  Lab, imaging reviewed.  Normal.  Unsure as to the etiology of his symptoms, but there does not appear to be an emergency today.  Will refer to cardiology for cardiac monitor for further workup.  Will set patient up with PCP prior to discharge.  ER return precautions given.  I also suspect an upper respiratory infection/acute cough.  Will send home with prednisone  40 mg for 5 days and albuterol  inhaler with  a spacer every 4-6 hours as needed, saline nasal irrigation, Mucinex, Promethazine DM.  Discussed labs, imaging, MDM, treatment plan, and plan for follow-up with patient Discussed sn/sx that should prompt return to the ED. patient agrees with plan.   Meds ordered this encounter  Medications   fluticasone  (FLONASE ) 50 MCG/ACT nasal spray     Sig: Place 2 sprays into both nostrils daily.    Dispense:  16 g    Refill:  0   albuterol  (VENTOLIN  HFA) 108 (90 Base) MCG/ACT inhaler    Sig: Inhale 1-2 puffs into the lungs every 4 (four) hours as needed for wheezing or shortness of breath.    Dispense:  1 each    Refill:  0   promethazine-dextromethorphan (PROMETHAZINE-DM) 6.25-15 MG/5ML syrup    Sig: Take 5 mLs by mouth 4 (four) times daily as needed for cough.    Dispense:  118 mL    Refill:  0   Spacer/Aero-Holding Chambers (AEROCHAMBER MV) inhaler    Sig: Use as instructed    Dispense:  1 each    Refill:  1   predniSONE  (DELTASONE ) 20 MG tablet    Sig: Take 2 tablets (40 mg total) by mouth daily with breakfast for 5 days.    Dispense:  10 tablet    Refill:  0      *This clinic note was created using Scientist, clinical (histocompatibility and immunogenetics). Therefore, there may be occasional mistakes despite careful proofreading. ?    Ethlyn Herd, MD 11/20/23 1539

## 2023-11-20 NOTE — ED Triage Notes (Addendum)
 Pt presents to UC for c/o chest congestion, cough, shortness of breath x2 days. Pt reports for the past 2 weeks, he has felt bouts of shortness of breath and palpitations. Pt states he has been drinking heavily for the past 2 weeks.

## 2023-11-20 NOTE — Discharge Instructions (Signed)
 Your EKG, chest x-ray, CBC, CMP are normal.  Will contact you if and only if your magnesium and/or TSH come back abnormal and we need to change management.  I have put in an urgent referral to cardiology for further evaluation.  I suspect that you have an upper respiratory infection contributing to your symptoms.  I am giving you prednisone  40 mg for 5 days, 2 puffs from your albuterol  inhaler with your spacer every 4-6 hours as needed for shortness of breath, wheezing, saline nasal irrigation with a NeilMed sinus rinse and distilled water as often as you want, Flonase , Mucinex for nasal congestion, Promethazine DM for cough.

## 2023-11-21 ENCOUNTER — Ambulatory Visit: Admitting: Family Medicine
